# Patient Record
Sex: Female | Born: 1985
Health system: Southern US, Community
[De-identification: ages and names within clinical notes are randomized; demographics above are authoritative.]

## PROBLEM LIST (undated history)

## (undated) DIAGNOSIS — J45909 Unspecified asthma, uncomplicated: Secondary | ICD-10-CM

## (undated) DIAGNOSIS — T7840XA Allergy, unspecified, initial encounter: Secondary | ICD-10-CM

## (undated) HISTORY — DX: Allergy, unspecified, initial encounter: T78.40XA

## (undated) HISTORY — PX: HIP SURGERY: SHX245

---
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
## 2005-11-12 ENCOUNTER — Ambulatory Visit (HOSPITAL_COMMUNITY): Admission: RE | Admit: 2005-11-12 | Discharge: 2005-11-12 | Payer: Self-pay | Admitting: Family Medicine

## 2005-11-12 ENCOUNTER — Emergency Department (HOSPITAL_COMMUNITY): Admission: EM | Admit: 2005-11-12 | Discharge: 2005-11-12 | Payer: Self-pay | Admitting: Family Medicine

## 2006-07-14 ENCOUNTER — Emergency Department (HOSPITAL_COMMUNITY): Admission: EM | Admit: 2006-07-14 | Discharge: 2006-07-14 | Payer: Self-pay | Admitting: Family Medicine

## 2007-04-11 ENCOUNTER — Emergency Department (HOSPITAL_COMMUNITY): Admission: EM | Admit: 2007-04-11 | Discharge: 2007-04-11 | Payer: Self-pay | Admitting: Emergency Medicine

## 2007-05-27 ENCOUNTER — Emergency Department (HOSPITAL_COMMUNITY): Admission: EM | Admit: 2007-05-27 | Discharge: 2007-05-28 | Payer: Self-pay | Admitting: Emergency Medicine

## 2007-10-16 ENCOUNTER — Emergency Department (HOSPITAL_COMMUNITY): Admission: EM | Admit: 2007-10-16 | Discharge: 2007-10-16 | Payer: Self-pay | Admitting: Emergency Medicine

## 2007-12-28 ENCOUNTER — Emergency Department (HOSPITAL_COMMUNITY): Admission: EM | Admit: 2007-12-28 | Discharge: 2007-12-28 | Payer: Self-pay | Admitting: Emergency Medicine

## 2008-01-22 ENCOUNTER — Emergency Department (HOSPITAL_COMMUNITY): Admission: EM | Admit: 2008-01-22 | Discharge: 2008-01-22 | Payer: Self-pay | Admitting: Emergency Medicine

## 2009-06-24 ENCOUNTER — Emergency Department (HOSPITAL_COMMUNITY): Admission: EM | Admit: 2009-06-24 | Discharge: 2009-06-24 | Payer: Self-pay | Admitting: Emergency Medicine

## 2009-10-25 ENCOUNTER — Emergency Department (HOSPITAL_COMMUNITY): Admission: EM | Admit: 2009-10-25 | Discharge: 2009-10-25 | Payer: Self-pay | Admitting: Family Medicine

## 2010-12-11 LAB — RAPID STREP SCREEN (MED CTR MEBANE ONLY): Streptococcus, Group A Screen (Direct): NEGATIVE

## 2011-05-29 LAB — URINE CULTURE: Colony Count: 70000

## 2011-05-29 LAB — URINALYSIS, ROUTINE W REFLEX MICROSCOPIC
Glucose, UA: NEGATIVE
Ketones, ur: 15 — AB
Protein, ur: NEGATIVE
Urobilinogen, UA: 1

## 2011-05-29 LAB — URINE MICROSCOPIC-ADD ON

## 2011-06-22 LAB — COMPREHENSIVE METABOLIC PANEL
Albumin: 4.1
Alkaline Phosphatase: 58
BUN: 7
CO2: 26
Chloride: 108
Creatinine, Ser: 0.81
GFR calc non Af Amer: >60
Glucose, Bld: 90
Potassium: 4.2
Total Bilirubin: 1

## 2011-06-22 LAB — URINALYSIS, ROUTINE W REFLEX MICROSCOPIC
Glucose, UA: NEGATIVE
Ketones, ur: 15 — AB
Protein, ur: NEGATIVE
Urobilinogen, UA: 1

## 2011-06-22 LAB — CBC
HCT: 38
MCV: 91.8
Platelets: 200
RDW: 12.7
WBC: 3.2 — ABNORMAL LOW

## 2011-06-22 LAB — I-STAT 8, (EC8 V) (CONVERTED LAB)
Acid-base deficit: 1
Bicarbonate: 23.3
Glucose, Bld: 91
TCO2: 24
pCO2, Ven: 37.7 — ABNORMAL LOW
pH, Ven: 7.398 — ABNORMAL HIGH

## 2011-06-22 LAB — DIFFERENTIAL
Basophils Absolute: 0
Eosinophils Absolute: 0.1
Eosinophils Relative: 3
Neutrophils Relative %: 43

## 2011-06-22 LAB — POCT I-STAT CREATININE
Creatinine, Ser: 0.9
Operator id: 285841

## 2011-06-22 LAB — POCT PREGNANCY, URINE: Operator id: 285841

## 2011-06-22 LAB — URINE MICROSCOPIC-ADD ON

## 2011-06-22 LAB — LIPASE, BLOOD: Lipase: 14

## 2011-08-26 ENCOUNTER — Other Ambulatory Visit: Payer: Self-pay

## 2011-08-26 ENCOUNTER — Encounter: Payer: Self-pay | Admitting: Emergency Medicine

## 2011-08-26 ENCOUNTER — Emergency Department (HOSPITAL_COMMUNITY)
Admission: EM | Admit: 2011-08-26 | Discharge: 2011-08-27 | Disposition: A | Discharge status: Home / Self Care | Payer: Self-pay | Attending: Emergency Medicine | Admitting: Emergency Medicine

## 2011-08-26 ENCOUNTER — Emergency Department (HOSPITAL_COMMUNITY): Payer: Self-pay

## 2011-08-26 DIAGNOSIS — R079 Chest pain, unspecified: Secondary | ICD-10-CM | POA: Insufficient documentation

## 2011-08-26 DIAGNOSIS — R059 Cough, unspecified: Secondary | ICD-10-CM | POA: Insufficient documentation

## 2011-08-26 DIAGNOSIS — R05 Cough: Secondary | ICD-10-CM | POA: Insufficient documentation

## 2011-08-26 DIAGNOSIS — R509 Fever, unspecified: Secondary | ICD-10-CM | POA: Insufficient documentation

## 2011-08-26 DIAGNOSIS — J111 Influenza due to unidentified influenza virus with other respiratory manifestations: Secondary | ICD-10-CM | POA: Insufficient documentation

## 2011-08-26 DIAGNOSIS — R0601 Orthopnea: Secondary | ICD-10-CM | POA: Insufficient documentation

## 2011-08-26 DIAGNOSIS — IMO0001 Reserved for inherently not codable concepts without codable children: Secondary | ICD-10-CM | POA: Insufficient documentation

## 2011-08-26 DIAGNOSIS — J45901 Unspecified asthma with (acute) exacerbation: Secondary | ICD-10-CM | POA: Insufficient documentation

## 2011-08-26 NOTE — ED Notes (Signed)
Pt sts that today she started to have central CP described as a burning and sharp pain. Pain increases with movement and palpation.

## 2011-08-27 ENCOUNTER — Emergency Department (HOSPITAL_COMMUNITY): Payer: Self-pay

## 2011-08-27 LAB — POCT I-STAT, CHEM 8
Chloride: 106 mEq/L (ref 96–112)
HCT: 40 % (ref 36.0–46.0)
Potassium: 3.5 mEq/L (ref 3.5–5.1)

## 2011-08-27 LAB — CBC
HCT: 36.4 % (ref 36.0–46.0)
Platelets: 155 10*3/uL (ref 150–400)
RBC: 4.07 MIL/uL (ref 3.87–5.11)
RDW: 12.4 % (ref 11.5–15.5)
WBC: 5.5 10*3/uL (ref 4.0–10.5)

## 2011-08-27 LAB — POCT I-STAT TROPONIN I: Troponin i, poc: 0 ng/mL (ref 0.00–0.08)

## 2011-08-27 LAB — POCT PREGNANCY, URINE: Preg Test, Ur: NEGATIVE

## 2011-08-27 MED ORDER — OSELTAMIVIR PHOSPHATE 75 MG PO CAPS: 75.0000 mg | ORAL_CAPSULE | Freq: Two times a day (BID) | ORAL | Status: AC

## 2011-08-27 MED ORDER — ALBUTEROL SULFATE (5 MG/ML) 0.5% IN NEBU
2.5000 mg | INHALATION_SOLUTION | Freq: Once | RESPIRATORY_TRACT | Status: AC
Start: 2011-08-27 — End: 2011-08-27
  Administered 2011-08-27: 2.5 mg via RESPIRATORY_TRACT
  Filled 2011-08-27: qty 0.5

## 2011-08-27 MED ORDER — IBUPROFEN 800 MG PO TABS
800.0000 mg | ORAL_TABLET | Freq: Once | ORAL | Status: DC
  Filled 2011-08-27: qty 1

## 2011-08-27 MED ORDER — ALBUTEROL SULFATE HFA 108 (90 BASE) MCG/ACT IN AERS: 1.0000 | INHALATION_SPRAY | Freq: Four times a day (QID) | RESPIRATORY_TRACT | Status: DC | PRN

## 2011-08-27 MED ORDER — IPRATROPIUM BROMIDE 0.02 % IN SOLN
0.5000 mg | Freq: Once | RESPIRATORY_TRACT | Status: AC
  Administered 2011-08-27: 0.5 mg via RESPIRATORY_TRACT
  Filled 2011-08-27: qty 2.5

## 2011-08-27 MED ORDER — TRAMADOL HCL 50 MG PO TABS
50.0000 mg | ORAL_TABLET | Freq: Once | ORAL | Status: AC
  Administered 2011-08-27: 50 mg via ORAL
  Filled 2011-08-27: qty 1

## 2011-08-27 NOTE — ED Notes (Signed)
Pt complaining that she has been having mid-sternal chest pain since yesterday. She has been coughing and complaining of SOB. Currently, she is not SOB. No CP at this time. But according, the patient she had a near syncopal episode while walking to the bed. She is not dizzy at this moment. Lung sounds expiratory wheezing. Will continue to monitor.

## 2011-08-27 NOTE — ED Provider Notes (Signed)
History     CSN: [id number] Arrival date & time: 08/26/2011  8:57 PM   First MD Initiated Contact with Patient 08/27/11 0024      Chief Complaint  Patient presents with  . Chest Pain    (Consider location/radiation/quality/duration/timing/severity/associated sxs/prior treatment) Patient is a 25 y.o. female presenting with chest pain and wheezing. The history is provided by the patient. No language interpreter was used.  Chest Pain The chest pain began yesterday. Duration of episode(s) is 12 hours. Chest pain occurs constantly. The chest pain is unchanged. Associated with: myalgias wheezing and fever. At its most intense, the pain is at 9/10. The pain is currently at 9/10. The quality of the pain is described as sharp. The pain does not radiate. Exacerbated by: nothing. Primary symptoms include a fever, cough and wheezing. Pertinent negatives for primary symptoms include no fatigue, no syncope, no shortness of breath, no palpitations, no abdominal pain, no nausea, no vomiting, no dizziness and no altered mental status.  The patient's medical history is significant for asthma.  Associated symptoms include orthopnea.  Pertinent negatives for associated symptoms include no claudication, no diaphoresis and no lower extremity edema. She tried nothing for the symptoms. Risk factors include no known risk factors.  Pertinent negatives for past medical history include no aneurysm and no Marfan's syndrome.  Pertinent negatives for family medical history include: family history of aortic dissection.  Procedure history is negative for cardiac catheterization, echocardiogram, persantine thallium, stress echo, stress thallium and exercise treadmill test.    Wheezing  The current episode started yesterday. The onset was gradual. The problem occurs continuously. The problem has been unchanged. The problem is moderate. The symptoms are relieved by nothing. The symptoms are aggravated by nothing. Associated  symptoms include chest pain, orthopnea, a fever, cough and wheezing. Pertinent negatives include no sore throat, no stridor and no shortness of breath. There was no intake of a foreign body. She was not exposed to toxic fumes. She has not inhaled smoke recently. She has had no prior hospitalizations. She has had no prior ICU admissions. She has had no prior intubations. Her past medical history is significant for asthma. She has been behaving normally. Urine output has been normal. The last void occurred less than 6 hours ago. There were sick contacts at home and at school. She has received no recent medical care.  Roommate has influenza and she has myalgias, wheezing fevers, chills.  PERC negative, no ocp no long car trips or plane trips no swelling of the lower extremities  History reviewed. No pertinent past medical history.  History reviewed. No pertinent past surgical history.  History reviewed. No pertinent family history.  History  Substance Use Topics  . Smoking status: Never Smoker   . Smokeless tobacco: Not on file  . Alcohol Use: Yes    OB History    Grav Para Term Preterm Abortions TAB SAB Ect Mult Living                  Review of Systems  Constitutional: Positive for fever. Negative for diaphoresis and fatigue.  HENT: Negative for sore throat.   Eyes: Negative for discharge.  Respiratory: Positive for cough and wheezing. Negative for shortness of breath and stridor.   Cardiovascular: Positive for chest pain and orthopnea. Negative for palpitations, claudication and syncope.  Gastrointestinal: Negative for nausea, vomiting and abdominal pain.  Genitourinary: Negative for difficulty urinating.  Neurological: Negative for dizziness.  Psychiatric/Behavioral: Negative for altered mental status.  Allergies  Review of patient's allergies indicates no known allergies.  Home Medications   Current Outpatient Rx  Name Route Sig Dispense Refill  . VITAMIN C PO Oral Take 1  tablet by mouth daily.      . DAYQUIL PO Oral Take 1 tablet by mouth 2 (two) times daily as needed. For cold symptoms.     Marland Kitchen VITAMIN A PO Oral Take 1 tablet by mouth daily.        BP 121/58  Pulse 94  Temp(Src) 100.6 F (38.1 C) (Oral)  Resp 16  SpO2 97%  LMP 07/29/2011  Physical Exam  Constitutional: She is oriented to person, place, and time. She appears well-developed and well-nourished.  HENT:  Head: Normocephalic and atraumatic.  Mouth/Throat: Oropharynx is clear and moist. No oropharyngeal exudate.  Eyes: EOM are normal. Pupils are equal, round, and reactive to light.  Neck: Normal range of motion. Neck supple. No tracheal deviation present.  Cardiovascular: Normal rate and regular rhythm.   Pulmonary/Chest: Effort normal. No stridor. She has wheezes.  Abdominal: Soft. Bowel sounds are normal. There is no tenderness. There is no rebound and no guarding.  Musculoskeletal: Normal range of motion.  Lymphadenopathy:    She has cervical adenopathy.  Neurological: She is alert and oriented to person, place, and time.  Skin: Skin is warm and dry.  Psychiatric: Thought content normal.    ED Course  Procedures (including critical care time)   Labs Reviewed  CBC  POCT I-STAT, CHEM 8  POCT I-STAT TROPONIN I  POCT PREGNANCY, URINE  I-STAT, CHEM 8  I-STAT TROPONIN I  POCT PREGNANCY, URINE   Dg Chest 2 View  08/26/2011  *RADIOLOGY REPORT*  Clinical Data: Chest pain and short of breath  CHEST - 2 VIEW  Comparison:  None.  Findings:  The heart size and mediastinal contours are within normal limits.  Both lungs are clear.  The visualized skeletal structures are unremarkable.  IMPRESSION: No active cardiopulmonary disease.  Original Report Authenticated By: Camelia Phenes, M.D.     No diagnosis found.    MDM   Date: 08/27/2011  Rate:95  Rhythm: sinus arrhythmia  QRS Axis: sinus with sinus arrhythmia  Intervals: normal  ST/T Wave abnormalities: normal  Conduction  Disutrbances:none  Narrative Interpretation:   Old EKG Reviewed: none available   PERC negative, highly doubt PE  Symptoms consistent with asthma and influenza.  Will prescribe tamiflu and have patient follow up with PMD for recheck.  Return for worsening chest pain shortness of breath or any concerns.  Patient and parents verbalize understanding and agree to follow up    Aziah Brostrom K Rasa Degrazia-Rasch, MD 08/27/11 [phone]

## 2013-04-27 ENCOUNTER — Encounter (HOSPITAL_COMMUNITY): Payer: Self-pay | Admitting: Emergency Medicine

## 2013-04-27 ENCOUNTER — Emergency Department (HOSPITAL_COMMUNITY)
Admission: EM | Admit: 2013-04-27 | Discharge: 2013-04-28 | Disposition: A | Discharge status: Home / Self Care | Payer: Self-pay | Attending: Emergency Medicine | Admitting: Emergency Medicine

## 2013-04-27 DIAGNOSIS — R05 Cough: Secondary | ICD-10-CM | POA: Insufficient documentation

## 2013-04-27 DIAGNOSIS — J45909 Unspecified asthma, uncomplicated: Secondary | ICD-10-CM | POA: Insufficient documentation

## 2013-04-27 DIAGNOSIS — R599 Enlarged lymph nodes, unspecified: Secondary | ICD-10-CM | POA: Insufficient documentation

## 2013-04-27 DIAGNOSIS — R059 Cough, unspecified: Secondary | ICD-10-CM | POA: Insufficient documentation

## 2013-04-27 DIAGNOSIS — H53149 Visual discomfort, unspecified: Secondary | ICD-10-CM | POA: Insufficient documentation

## 2013-04-27 DIAGNOSIS — Z3202 Encounter for pregnancy test, result negative: Secondary | ICD-10-CM | POA: Insufficient documentation

## 2013-04-27 DIAGNOSIS — R5381 Other malaise: Secondary | ICD-10-CM | POA: Insufficient documentation

## 2013-04-27 DIAGNOSIS — J029 Acute pharyngitis, unspecified: Secondary | ICD-10-CM | POA: Insufficient documentation

## 2013-04-27 DIAGNOSIS — R110 Nausea: Secondary | ICD-10-CM | POA: Insufficient documentation

## 2013-04-27 DIAGNOSIS — H93239 Hyperacusis, unspecified ear: Secondary | ICD-10-CM | POA: Insufficient documentation

## 2013-04-27 DIAGNOSIS — B2790 Infectious mononucleosis, unspecified without complication: Secondary | ICD-10-CM | POA: Insufficient documentation

## 2013-04-27 DIAGNOSIS — R51 Headache: Secondary | ICD-10-CM | POA: Insufficient documentation

## 2013-04-27 HISTORY — DX: Unspecified asthma, uncomplicated: J45.909

## 2013-04-27 NOTE — ED Notes (Signed)
PT. REPORTS NASAL CONGESTION " PRESSURE " / RUNNY NOSE / LIGHTHEADED ONSET YESTERDAY.

## 2013-04-28 LAB — POCT I-STAT, CHEM 8
BUN: 14 mg/dL (ref 6–23)
Chloride: 111 mEq/L (ref 96–112)
Creatinine, Ser: 0.8 mg/dL (ref 0.50–1.10)
Glucose, Bld: 97 mg/dL (ref 70–99)
Potassium: 3.6 mEq/L (ref 3.5–5.1)

## 2013-04-28 LAB — URINALYSIS, ROUTINE W REFLEX MICROSCOPIC
Glucose, UA: NEGATIVE mg/dL
Ketones, ur: NEGATIVE mg/dL
Leukocytes, UA: NEGATIVE
Protein, ur: NEGATIVE mg/dL

## 2013-04-28 MED ORDER — PROMETHAZINE HCL 25 MG PO TABS: 25.0000 mg | ORAL_TABLET | Freq: Four times a day (QID) | ORAL | Status: DC | PRN

## 2013-04-28 MED ORDER — IBUPROFEN 800 MG PO TABS: 800.0000 mg | ORAL_TABLET | Freq: Three times a day (TID) | ORAL | Status: DC

## 2013-04-28 MED ORDER — METOCLOPRAMIDE HCL 5 MG/ML IJ SOLN
10.0000 mg | Freq: Once | INTRAMUSCULAR | Status: AC
  Administered 2013-04-28: 10 mg via INTRAMUSCULAR
  Filled 2013-04-28: qty 2

## 2013-04-28 MED ORDER — PSEUDOEPHEDRINE HCL 30 MG PO TABS
30.0000 mg | ORAL_TABLET | Freq: Once | ORAL | Status: AC
  Administered 2013-04-28: 30 mg via ORAL
  Filled 2013-04-28: qty 1

## 2013-04-28 MED ORDER — ONDANSETRON 4 MG PO TBDP
8.0000 mg | ORAL_TABLET | Freq: Once | ORAL | Status: AC
Start: 2013-04-28 — End: 2013-04-28
  Administered 2013-04-28: 8 mg via ORAL
  Filled 2013-04-28: qty 2

## 2013-04-28 NOTE — ED Provider Notes (Signed)
CSN: [id number]     Arrival date & time 04/27/13  2341 History     None    Chief Complaint  Patient presents with  . Nasal Congestion   (Consider location/radiation/quality/duration/timing/severity/associated sxs/prior Treatment) HPI History provided by pt.   27yo F presents w/ severe, non-traumatic, frontal head pressure for the last 2 days.  Pain worsened last night.  Was associated w/ blurred vision, but that has resolved.  Has also had nasal congestion, intermittent sore throat and cough, photo/phonophobia, nausea, generalized weakness and fatigue.  LMP 4 days ago and bleed heavily (10 tampons and 5 pads in <24hrs).  Denies urinary sx.  Took an OTC decongestant w/out relief.  H/o sinusitis and this pain feels similar but more severe.  Has never had a migraine. Past Medical History  Diagnosis Date  . Asthma    Past Surgical History  Procedure Laterality Date  . Hip surgery     No family history on file. History  Substance Use Topics  . Smoking status: Never Smoker   . Smokeless tobacco: Not on file  . Alcohol Use: Yes   OB History   Grav Para Term Preterm Abortions TAB SAB Ect Mult Living                 Review of Systems  All other systems reviewed and are negative.    Allergies  Claritin and Flonase  Home Medications   Current Outpatient Rx  Name  Route  Sig  Dispense  Refill  . DiphenhydrAMINE HCl (MULTI-SYMPTOM ALLERGY PO)   Oral   Take 1 tablet by mouth every 6 (six) hours as needed (sinus congestion).          BP 139/68  Pulse 63  Temp(Src) 98.7 F (37.1 C) (Oral)  Resp 14  SpO2 100%  LMP 04/20/2013 Physical Exam  Nursing note and vitals reviewed. Constitutional: She is oriented to person, place, and time. She appears well-developed and well-nourished. No distress.  HENT:  Head: Normocephalic and atraumatic.  Pt points to pain at bridge of her nose.  Sinuses non-tender; palpation relieves some of the pressure.  Soft palate and posterior pharynx  erythematous.  No edema or exudate of tonsils.    Eyes:  Normal appearance  Neck: Normal range of motion.  No meningismus  Cardiovascular: Normal rate, regular rhythm and intact distal pulses.   Pulmonary/Chest: Effort normal and breath sounds normal.  Abdominal: Soft. Bowel sounds are normal. She exhibits no distension. There is no tenderness.  Musculoskeletal: Normal range of motion.  Lymphadenopathy:    She has cervical adenopathy.  Neurological: She is alert and oriented to person, place, and time. No sensory deficit. Coordination normal.  CN 3-12 intact.  No nystagmus. 5/5 and equal upper and lower extremity strength.  No past pointing.     Skin: Skin is warm and dry. No rash noted.  Psychiatric: She has a normal mood and affect. Her behavior is normal.    ED Course   Procedures (including critical care time)  Labs Reviewed  MONONUCLEOSIS SCREEN - Abnormal; Notable for the following:    Mono Screen POSITIVE (*)    All other components within normal limits  URINALYSIS, ROUTINE W REFLEX MICROSCOPIC  POCT PREGNANCY, URINE  POCT I-STAT, CHEM 8   No results found. 1. Mononucleosis     MDM  27yo healthy F presents w/ frontal headache, nausea, fatigue and generalized weakness.  Headache pain feels similar to past sinus infections but more severe. On exam,  afebrile, non-toxic appearing, no focal neuro deficits or meningeal signs, no sinus ttp, throat erythematous, anterior cervical adenopathy, abd benign, no rash.  Chem 8, monospot, U/A and urine preg pending.  Pt to receive IM reglan for headache pain and nausea, as well as sudafed. 1:45 AM   Headache and nausea improved.  Labs sig for positive monospot.  Results discussed w/ pt and her mother.  She is not involved in any contact sports.  Recommended rest, fluids, NSAIDs.  Prescribed phenergan for nausea and to be taken with benadryl for headache relief.  She understands that this is a contagious virus.  Return precautions  discussed.   Arie Sabina Azzure Garabedian, PA-C 04/28/13 0500

## 2013-04-28 NOTE — ED Notes (Signed)
Patient with sinus pressure, nausea and dizziness.  Patient states that it started yesterday, increasing today.

## 2013-04-28 NOTE — ED Notes (Signed)
Pt comfortable with d/c and f/u instructions. Prescriptions x2. 

## 2013-04-29 NOTE — ED Provider Notes (Signed)
Medical screening examination/treatment/procedure(s) were performed by non-physician practitioner and as supervising physician I was immediately available for consultation/collaboration.   Jakaiden Fill Y. Breslyn Abdo, MD 04/29/13 0140 

## 2013-05-08 ENCOUNTER — Encounter (HOSPITAL_COMMUNITY): Payer: Self-pay

## 2013-05-08 ENCOUNTER — Emergency Department (INDEPENDENT_AMBULATORY_CARE_PROVIDER_SITE_OTHER)
Admission: EM | Admit: 2013-05-08 | Discharge: 2013-05-08 | Disposition: A | Discharge status: Home / Self Care | Payer: Self-pay | Source: Home / Self Care | Attending: Emergency Medicine | Admitting: Emergency Medicine

## 2013-05-08 DIAGNOSIS — R51 Headache: Secondary | ICD-10-CM

## 2013-05-08 DIAGNOSIS — B2790 Infectious mononucleosis, unspecified without complication: Secondary | ICD-10-CM

## 2013-05-08 MED ORDER — IBUPROFEN 600 MG PO TABS: 600.0000 mg | ORAL_TABLET | Freq: Three times a day (TID) | ORAL | Status: DC

## 2013-05-08 NOTE — ED Provider Notes (Signed)
CSN: [id number]     Arrival date & time 05/08/13  1341 History   First MD Initiated Contact with Patient 05/08/13 1453     Chief Complaint  Patient presents with  . Headache   (Consider location/radiation/quality/duration/timing/severity/associated sxs/prior Treatment) HPI Comments: Pt wants to return to work after dx of mono 1.5 weeks ago.  Here because needs work note.  Feels much better, doesn't feel fatigued.  Also reports still has mild headache, has been taking ibuprofen 800mg  once daily to completely manage headache, wants refill of med.    The history is provided by the patient.    Past Medical History  Diagnosis Date  . Asthma    Past Surgical History  Procedure Laterality Date  . Hip surgery     History reviewed. No pertinent family history. History  Substance Use Topics  . Smoking status: Never Smoker   . Smokeless tobacco: Not on file  . Alcohol Use: Yes   OB History   Grav Para Term Preterm Abortions TAB SAB Ect Mult Living                 Review of Systems  HENT: Negative for sore throat.   Respiratory: Negative for cough.   Neurological: Positive for headaches.    Allergies  Claritin and Flonase  Home Medications   Current Outpatient Rx  Name  Route  Sig  Dispense  Refill  . DiphenhydrAMINE HCl (MULTI-SYMPTOM ALLERGY PO)   Oral   Take 1 tablet by mouth every 6 (six) hours as needed (sinus congestion).         Marland Kitchen ibuprofen (ADVIL,MOTRIN) 600 MG tablet   Oral   Take 1 tablet (600 mg total) by mouth 3 (three) times daily.   21 tablet   0   . ibuprofen (ADVIL,MOTRIN) 800 MG tablet   Oral   Take 1 tablet (800 mg total) by mouth 3 (three) times daily.   12 tablet   0   . promethazine (PHENERGAN) 25 MG tablet   Oral   Take 1 tablet (25 mg total) by mouth every 6 (six) hours as needed for nausea.   20 tablet   0    BP 161/118  Pulse 64  Temp(Src) 98.6 F (37 C) (Oral)  Resp 18  SpO2 100%  LMP 04/20/2013 Physical Exam  Constitutional:  She appears well-developed and well-nourished. She appears distressed.  Cardiovascular: Normal rate and regular rhythm.   Pulmonary/Chest: Effort normal and breath sounds normal.    ED Course  Procedures (including critical care time) Labs Review Labs Reviewed - No data to display Imaging Review No results found.  bp recheck was 121/68 using correct size of cuff MDM   1. Mononucleosis   2. Headache    Given note to return to work. Discussed with pt she is likely to be contagious for many more weeks to months, to avoid saliva exchange (kissing, sharing drinks) for 6 weeks, don't cough on anyone, careful handwashing. Reinforced if pt is fatigued that she needs to rest. Advised pt to cut down on ibuprofen by tapering, rx ibuprofen 600mg  TID prn pain #21 to prevent rebound headaches.     Cathlyn Parsons, NP 05/08/13 1512

## 2013-05-08 NOTE — ED Provider Notes (Signed)
Medical screening examination/treatment/procedure(s) were performed by non-physician practitioner and as supervising physician I was immediately available for consultation/collaboration.  Leslee Home, M.D.  Reuben Likes, MD 05/08/13 [phone]

## 2013-05-08 NOTE — ED Notes (Addendum)
Seen 3 weeks ago w d/c diagnosis of mono. 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

## 2013-08-21 ENCOUNTER — Encounter (HOSPITAL_COMMUNITY): Payer: Self-pay | Admitting: Emergency Medicine

## 2013-08-21 ENCOUNTER — Emergency Department (HOSPITAL_COMMUNITY)
Admission: EM | Admit: 2013-08-21 | Discharge: 2013-08-21 | Disposition: A | Discharge status: Home / Self Care | Payer: Self-pay | Attending: Emergency Medicine | Admitting: Emergency Medicine

## 2013-08-21 DIAGNOSIS — M545 Low back pain, unspecified: Secondary | ICD-10-CM | POA: Insufficient documentation

## 2013-08-21 DIAGNOSIS — Z79899 Other long term (current) drug therapy: Secondary | ICD-10-CM | POA: Insufficient documentation

## 2013-08-21 DIAGNOSIS — J45909 Unspecified asthma, uncomplicated: Secondary | ICD-10-CM | POA: Insufficient documentation

## 2013-08-21 DIAGNOSIS — Z888 Allergy status to other drugs, medicaments and biological substances status: Secondary | ICD-10-CM | POA: Insufficient documentation

## 2013-08-21 MED ORDER — HYDROCODONE-ACETAMINOPHEN 5-325 MG PO TABS: 1.0000 | ORAL_TABLET | ORAL | Status: DC | PRN

## 2013-08-21 MED ORDER — HYDROCODONE-ACETAMINOPHEN 5-325 MG PO TABS
1.0000 | ORAL_TABLET | Freq: Once | ORAL | Status: AC
  Administered 2013-08-21: 1 via ORAL
  Filled 2013-08-21: qty 1

## 2013-08-21 MED ORDER — CYCLOBENZAPRINE HCL 10 MG PO TABS: 10.0000 mg | ORAL_TABLET | Freq: Every day | ORAL | Status: DC

## 2013-08-21 MED ORDER — DEXAMETHASONE SODIUM PHOSPHATE 10 MG/ML IJ SOLN
10.0000 mg | Freq: Once | INTRAMUSCULAR | Status: AC
  Administered 2013-08-21: 10 mg via INTRAMUSCULAR
  Filled 2013-08-21: qty 1

## 2013-08-21 NOTE — ED Notes (Signed)
Pt reports L hip surgery as a child with chronic mild lower back pain since. This week back pain has increased in severity. Thinks she overdid it working long hours. Tried otc pain pills and topical creams with no relief of pain. No bowel/bladder changes

## 2013-08-21 NOTE — ED Provider Notes (Signed)
CSN: [id number]     Arrival date & time 08/21/13  1537 History  This chart was scribed for non-physician practitioner Mellody Drown, PA-C working with Nelia Shi, MD by Valera Castle, ED scribe. This patient was seen in room TR10C/TR10C and the patient's care was started at 4:43 PM.   Chief Complaint  Patient presents with  . Back Pain    The history is provided by the patient. No language interpreter was used.   HPI Comments: Brianna Garrett is a 27 y.o. female who presents to the Emergency Department complaining of sudden, burning, throbbing, constant, lower back pain, onset 1 week ago, with a pain severity of 7/10. She states the pain is exacerbated by reaching up with her arms, standing up, bending over, and any other kind of movement. She denies any recent back injury or fall. She reports h/o left hip surgery when she was a child, and was told she was going to have hip problems in the future. She reports taking Aleve, Ibuprofen for her pain, and applying [facility], all without any relief. 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 She denies fever, constipation, abdominal pain, bladder and bowel incontinence, numbness, tingling, and any other associated symptoms. She denies any allergies to medication. She denies h/o DM, but states that DM and heart disease run in the family.   PCP - No PCP Per Patient  Past Medical History  Diagnosis Date  . Asthma    Past Surgical History  Procedure Laterality Date  . Hip surgery     History reviewed. No pertinent family history. History  Substance Use Topics  . Smoking status: Never Smoker   . Smokeless tobacco: Not on file  . Alcohol Use: Yes   OB History   Grav Para Term Preterm Abortions TAB SAB Ect Mult Living                 Review of Systems  Constitutional: Negative for fever.  Gastrointestinal: Negative for abdominal pain and constipation.       Negative for bowel incontinence.  Genitourinary:   Negative for bladder incontinence.  Musculoskeletal: Positive for back pain (lower).  Neurological: Negative for numbness (and tingling).  All other systems reviewed and are negative.    Allergies  Claritin and Flonase  Home Medications   Current Outpatient Rx  Name  Route  Sig  Dispense  Refill  . DiphenhydrAMINE HCl (MULTI-SYMPTOM ALLERGY PO)   Oral   Take 1 tablet by mouth every 6 (six) hours as needed (sinus congestion).         Marland Kitchen ibuprofen (ADVIL,MOTRIN) 600 MG tablet   Oral   Take 1 tablet (600 mg total) by mouth 3 (three) times daily.   21 tablet   0   . ibuprofen (ADVIL,MOTRIN) 800 MG tablet   Oral   Take 1 tablet (800 mg total) by mouth 3 (three) times daily.   12 tablet   0   . promethazine (PHENERGAN) 25 MG tablet   Oral   Take 1 tablet (25 mg total) by mouth every 6 (six) hours as needed for nausea.   20 tablet   0    BP 125/92  Pulse 73  Temp(Src) 97.8 F (36.6 C) (Oral)  Resp 16  Ht 5\' 10"  (1.778 m)  Wt 190 lb (86.183 kg)  BMI 27.26 kg/m2  SpO2 98%  Physical Exam  Nursing note and vitals reviewed. Constitutional: She is oriented to person, place,  and time. She appears well-developed and well-nourished. No distress.  HENT:  Head: Normocephalic and atraumatic.  Eyes: EOM are normal.  Neck: Neck supple.  Cardiovascular: Normal rate.   Pulmonary/Chest: Effort normal. No respiratory distress.  Musculoskeletal: Normal range of motion. She exhibits no edema.       Cervical back: Normal.       Thoracic back: Normal.       Lumbar back: She exhibits tenderness. She exhibits no swelling, no deformity and no spasm.  Discomfort recreated with palpation of Left SI joint. No midline C-spine, T-spine, or L-spine tenderness with no step-offs, crepitus, or deformities noted.   Neurological: She is alert and oriented to person, place, and time. No sensory deficit.  Skin: Skin is warm and dry.  Psychiatric: She has a normal mood and affect. Her behavior is  normal.    ED Course  Procedures (including critical care time)  DIAGNOSTIC STUDIES: Oxygen Saturation is 98% on room air, normal by my interpretation.    COORDINATION OF CARE: 4:49 PM-Discussed treatment plan which includes clinical doubt of fx with pt at bedside and pt agreed to plan. Will give pt Decadron and Norco/Vicodin. Will give pt resources for f/u with back specialist.   Labs Review Labs Reviewed - No data to display Imaging Review No results found.  EKG Interpretation   None      No orders of the defined types were placed in this encounter.    MDM   1. Low back pain    Pt with a history of lower back pain presents with an increase in pain.  No red flags on exam or given in history.  Likely lumbosacral pain. Discussed treatment plan with the patient. Return precautions given. Reports understanding and no other concerns at this time.  Patient is stable for discharge at this time.   Meds given in ED:  Medications  dexamethasone (DECADRON) injection 10 mg (10 mg Intramuscular Given 08/21/13 1709)  HYDROcodone-acetaminophen (NORCO/VICODIN) 5-325 MG per tablet 1 tablet (1 tablet Oral Given 08/21/13 1709)    Discharge Medication List as of 08/21/2013  5:12 PM    START taking these medications   Details  cyclobenzaprine (FLEXERIL) 10 MG tablet Take 1 tablet (10 mg total) by mouth at bedtime., Starting 08/21/2013, Until Discontinued, Print    HYDROcodone-acetaminophen (NORCO/VICODIN) 5-325 MG per tablet Take 1 tablet by mouth every 4 (four) hours as needed., Starting 08/21/2013, Until Discontinued, Print        I personally performed the services described in this documentation, which was scribed in my presence. The recorded information has been reviewed and is accurate.    Clabe Seal, PA-C 08/24/13 2102

## 2013-09-07 NOTE — ED Provider Notes (Signed)
Medical screening examination/treatment/procedure(s) were performed by non-physician practitioner and as supervising physician I was immediately available for consultation/collaboration.   Nelia Shiobert L Amaurie Schreckengost, MD 09/07/13 [phone]

## 2013-10-14 ENCOUNTER — Encounter (HOSPITAL_COMMUNITY): Payer: Self-pay | Admitting: Emergency Medicine

## 2013-10-14 ENCOUNTER — Emergency Department (HOSPITAL_COMMUNITY)
Admission: EM | Admit: 2013-10-14 | Discharge: 2013-10-14 | Disposition: A | Discharge status: Home / Self Care | Payer: Self-pay | Source: Home / Self Care

## 2013-10-14 DIAGNOSIS — R0982 Postnasal drip: Secondary | ICD-10-CM

## 2013-10-14 DIAGNOSIS — J069 Acute upper respiratory infection, unspecified: Secondary | ICD-10-CM

## 2013-10-14 DIAGNOSIS — R21 Rash and other nonspecific skin eruption: Secondary | ICD-10-CM

## 2013-10-14 MED ORDER — CLOTRIMAZOLE-BETAMETHASONE 1-0.05 % EX CREA: TOPICAL_CREAM | CUTANEOUS | Status: DC

## 2013-10-14 NOTE — ED Notes (Signed)
Multiple complaints.  Patient has head cold and chest congestion, chest pain-dull, worse with cough, deep breathing, etc.  Patient also concerned for rash on thigh.  Patient concerned for diabetes, feels she has poor circulation

## 2013-10-14 NOTE — ED Provider Notes (Signed)
Medical screening examination/treatment/procedure(s) were performed by a resident physician or non-physician practitioner and as the supervising physician I was immediately available for consultation/collaboration.  Tima Curet, MD    Ambert Virrueta S Legna Mausolf, MD 10/14/13 2030 

## 2013-10-14 NOTE — Discharge Instructions (Signed)
Upper Respiratory Infection, Adult °An upper respiratory infection (URI) is also sometimes known as the common cold. The upper respiratory tract includes the nose, sinuses, throat, trachea, and bronchi. Bronchi are the airways leading to the lungs. Most people improve within 1 week, but symptoms can last up to 2 weeks. A residual cough may last even longer.  °CAUSES °Many different viruses can infect the tissues lining the upper respiratory tract. The tissues become irritated and inflamed and often become very moist. Mucus production is also common. A cold is contagious. You can easily spread the virus to others by oral contact. This includes kissing, sharing a glass, coughing, or sneezing. Touching your mouth or nose and then touching a surface, which is then touched by another person, can also spread the virus. °SYMPTOMS  °Symptoms typically develop 1 to 3 days after you come in contact with a cold virus. Symptoms vary from person to person. They may include: °· Runny nose. °· Sneezing. °· Nasal congestion. °· Sinus irritation. °· Sore throat. °· Loss of voice (laryngitis). °· Cough. °· Fatigue. °· Muscle aches. °· Loss of appetite. °· Headache. °· Low-grade fever. °DIAGNOSIS  °You might diagnose your own cold based on familiar symptoms, since most people get a cold 2 to 3 times a year. Your caregiver can confirm this based on your exam. Most importantly, your caregiver can check that your symptoms are not due to another disease such as strep throat, sinusitis, pneumonia, asthma, or epiglottitis. Blood tests, throat tests, and X-rays are not necessary to diagnose a common cold, but they may sometimes be helpful in excluding other more serious diseases. Your caregiver will decide if any further tests are required. °RISKS AND COMPLICATIONS  °You may be at risk for a more severe case of the common cold if you smoke cigarettes, have chronic heart disease (such as heart failure) or lung disease (such as asthma), or if  you have a weakened immune system. The very young and very old are also at risk for more serious infections. Bacterial sinusitis, middle ear infections, and bacterial pneumonia can complicate the common cold. The common cold can worsen asthma and chronic obstructive pulmonary disease (COPD). Sometimes, these complications can require emergency medical care and may be life-threatening. °PREVENTION  °The best way to protect against getting a cold is to practice good hygiene. Avoid oral or hand contact with people with cold symptoms. Wash your hands often if contact occurs. There is no clear evidence that vitamin C, vitamin E, echinacea, or exercise reduces the chance of developing a cold. However, it is always recommended to get plenty of rest and practice good nutrition. °TREATMENT  °Treatment is directed at relieving symptoms. There is no cure. Antibiotics are not effective, because the infection is caused by a virus, not by bacteria. Treatment may include: °· Increased fluid intake. Sports drinks offer valuable electrolytes, sugars, and fluids. °· Breathing heated mist or steam (vaporizer or shower). °· Eating chicken soup or other clear broths, and maintaining good nutrition. °· Getting plenty of rest. °· Using gargles or lozenges for comfort. °· Controlling fevers with ibuprofen or acetaminophen as directed by your caregiver. °· Increasing usage of your inhaler if you have asthma. °Zinc gel and zinc lozenges, taken in the first 24 hours of the common cold, can shorten the duration and lessen the severity of symptoms. Pain medicines may help with fever, muscle aches, and throat pain. A variety of non-prescription medicines are available to treat congestion and runny nose. Your caregiver   can make recommendations and may suggest nasal or lung inhalers for other symptoms.  HOME CARE INSTRUCTIONS   Only take over-the-counter or prescription medicines for pain, discomfort, or fever as directed by your  caregiver.  Use a warm mist humidifier or inhale steam from a shower to increase air moisture. This may keep secretions moist and make it easier to breathe.  Drink enough water and fluids to keep your urine clear or pale yellow.  Rest as needed.  Return to work when your temperature has returned to normal or as your caregiver advises. You may need to stay home longer to avoid infecting others. You can also use a face mask and careful hand washing to prevent spread of the virus. SEEK MEDICAL CARE IF:   After the first few days, you feel you are getting worse rather than better.  You need your caregiver's advice about medicines to control symptoms.  You develop chills, worsening shortness of breath, or brown or red sputum. These may be signs of pneumonia.  You develop yellow or brown nasal discharge or pain in the face, especially when you bend forward. These may be signs of sinusitis.  You develop a fever, swollen neck glands, pain with swallowing, or white areas in the back of your throat. These may be signs of strep throat. SEEK IMMEDIATE MEDICAL CARE IF:   You have a fever.  You develop severe or persistent headache, ear pain, sinus pain, or chest pain.  You develop wheezing, a prolonged cough, cough up blood, or have a change in your usual mucus (if you have chronic lung disease).  You develop sore muscles or a stiff neck. Document Released: 02/17/2001 Document Revised: 11/16/2011 Document Reviewed: 12/26/2010 [facility] Patient Information 2014 [place], [place].  Rash A rash is a change in the color or texture of your skin. There are many different types of rashes. You may have other problems that accompany your rash. CAUSES   Infections.  Allergic reactions. This can include allergies to pets or foods.  Certain medicines.  Exposure to certain chemicals, soaps, or cosmetics.  Heat.  Exposure to poisonous plants.  Tumors, both cancerous and noncancerous. SYMPTOMS    Redness.  Scaly skin.  Itchy skin.  Dry or cracked skin.  Bumps.  Blisters.  Pain. DIAGNOSIS  Your caregiver may do a physical exam to determine what type of rash you have. A skin sample (biopsy) may be taken and examined under a microscope. TREATMENT  Treatment depends on the type of rash you have. Your caregiver may prescribe certain medicines. For serious conditions, you may need to see a skin doctor (dermatologist). HOME CARE INSTRUCTIONS   Avoid the substance that caused your rash.  Do not scratch your rash. This can cause infection.  You may take cool baths to help stop itching.  Only take over-the-counter or prescription medicines as directed by your caregiver.  Keep all follow-up appointments as directed by your caregiver. SEEK IMMEDIATE MEDICAL CARE IF:  You have increasing pain, swelling, or redness.  You have a fever.  You have new or severe symptoms.  You have body aches, diarrhea, or vomiting.  Your rash is not better after 3 days. MAKE SURE YOU:  Understand these instructions.  Will watch your condition.  Will get help right away if you are not doing well or get worse. Document Released: 08/14/2002 Document Revised: 11/16/2011 Document Reviewed: 06/08/2011 [facility] Patient Information 2014 [place], [place].

## 2013-10-14 NOTE — ED Provider Notes (Signed)
CSN: [id number]     Arrival date & time 10/14/13  1545 History   First MD Initiated Contact with Patient 10/14/13 1709     Chief Complaint  Patient presents with  . URI   (Consider location/radiation/quality/duration/timing/severity/associated sxs/prior Treatment) HPI Comments: 28 year old female complaining of PND, sore throat, cough and sneeze approximately one week. Her second complaint is that of a rash on the left thigh that she has had for 2 months. It comes and goes. She states it started a couple months ago after she was diagnosed with mono   Past Medical History  Diagnosis Date  . Asthma    Past Surgical History  Procedure Laterality Date  . Hip surgery     No family history on file. History  Substance Use Topics  . Smoking status: Current Every Day Smoker  . Smokeless tobacco: Not on file  . Alcohol Use: Yes   OB History   Grav Para Term Preterm Abortions TAB SAB Ect Mult Living                 Review of Systems  Constitutional: Positive for activity change and appetite change. Negative for fever, chills and fatigue.  HENT: Positive for congestion, postnasal drip, rhinorrhea and sore throat. Negative for facial swelling.   Eyes: Negative.   Respiratory: Positive for cough. Negative for shortness of breath.   Cardiovascular: Negative.   Gastrointestinal: Negative.   Musculoskeletal: Negative.  Negative for neck pain and neck stiffness.  Skin: Negative for pallor and rash.  Neurological: Negative.     Allergies  Claritin and Flonase  Home Medications   Current Outpatient Rx  Name  Route  Sig  Dispense  Refill  . guaiFENesin (MUCINEX) 600 MG 12 hr tablet   Oral   Take by mouth 2 (two) times daily.         . Pseudoeph-CPM-DM-APAP (TYLENOL COLD PO)   Oral   Take by mouth.         . Aspirin-Salicylamide-Caffeine (BC HEADACHE POWDER PO)   Oral   Take 1 packet by mouth daily as needed (for pain).         . clotrimazole-betamethasone (LOTRISONE)  cream      Apply to affected area 2 times daily prn   30 g   0   . cyclobenzaprine (FLEXERIL) 10 MG tablet   Oral   Take 1 tablet (10 mg total) by mouth at bedtime.   10 tablet   0   . HYDROcodone-acetaminophen (NORCO/VICODIN) 5-325 MG per tablet   Oral   Take 1 tablet by mouth every 4 (four) hours as needed.   10 tablet   0   . ibuprofen (ADVIL,MOTRIN) 200 MG tablet   Oral   Take 400 mg by mouth daily as needed for mild pain.         . Menthol, Topical Analgesic, (ICY HOT) 16 % LIQD   Apply externally   Apply 1 application topically daily as needed (for muscle pain).         . naproxen sodium (ANAPROX) 220 MG tablet   Oral   Take 440 mg by mouth daily as needed (for pain).          BP 129/84  Pulse 80  Temp(Src) 99.1 F (37.3 C) (Oral)  Resp 18  SpO2 100%  LMP 09/26/2013 Physical Exam  Nursing note and vitals reviewed. Constitutional: She is oriented to person, place, and time. She appears well-developed and well-nourished. No distress.  HENT:  Mouth/Throat: No oropharyngeal exudate.  Bilateral TMs are normal Oropharynx with minor erythema in copious amount of clear PND  Eyes: Conjunctivae and EOM are normal.  Neck: Normal range of motion. Neck supple.  Cardiovascular: Normal rate, regular rhythm and normal heart sounds.   Pulmonary/Chest: Effort normal and breath sounds normal. No respiratory distress. She has no wheezes. She has no rales.  Musculoskeletal: Normal range of motion. She exhibits no edema.  Lymphadenopathy:    She has no cervical adenopathy.  Neurological: She is alert and oriented to person, place, and time.  Skin: Skin is warm and dry. Rash noted.  There is a macular and slightly darker pigmentation in small areas of the left anterior and medial thigh. Well marginated but with irregular borders. Sometimes itch.  Psychiatric: She has a normal mood and affect.    ED Course  Procedures (including critical care time) Labs Review Labs  Reviewed - No data to display Imaging Review No results found.    MDM   1. URI (upper respiratory infection)   2. PND (post-nasal drip)   3. Rash and nonspecific skin eruption     Uncertain as to what the rash he is. He does have some characteristic of fungal etiology. We will try Lotrisone cream twice a day for at least 2 weeks Take Alka-Seltzer cold plus nighttime medicine with antihistamine to help with the PND that is most likely causing her cough. Tylenol every 4 hours when necessary pain, lots of fluids, rest. Followup with a PCP C. numbers listed above.     Hayden Rasmussenavid Dwane Andres, NP 10/14/13 1724

## 2013-11-13 ENCOUNTER — Encounter (HOSPITAL_COMMUNITY): Payer: Self-pay | Admitting: Emergency Medicine

## 2013-11-13 ENCOUNTER — Emergency Department (INDEPENDENT_AMBULATORY_CARE_PROVIDER_SITE_OTHER)
Admission: EM | Admit: 2013-11-13 | Discharge: 2013-11-13 | Disposition: A | Discharge status: Home / Self Care | Payer: Self-pay | Source: Home / Self Care | Attending: Emergency Medicine | Admitting: Emergency Medicine

## 2013-11-13 ENCOUNTER — Ambulatory Visit (HOSPITAL_COMMUNITY): Payer: Self-pay | Attending: Emergency Medicine

## 2013-11-13 DIAGNOSIS — R042 Hemoptysis: Secondary | ICD-10-CM | POA: Insufficient documentation

## 2013-11-13 DIAGNOSIS — R05 Cough: Secondary | ICD-10-CM | POA: Insufficient documentation

## 2013-11-13 DIAGNOSIS — J209 Acute bronchitis, unspecified: Secondary | ICD-10-CM

## 2013-11-13 DIAGNOSIS — R509 Fever, unspecified: Secondary | ICD-10-CM | POA: Insufficient documentation

## 2013-11-13 DIAGNOSIS — R059 Cough, unspecified: Secondary | ICD-10-CM | POA: Insufficient documentation

## 2013-11-13 DIAGNOSIS — J019 Acute sinusitis, unspecified: Secondary | ICD-10-CM

## 2013-11-13 MED ORDER — PREDNISONE 20 MG PO TABS: 20.0000 mg | ORAL_TABLET | Freq: Two times a day (BID) | ORAL | Status: DC

## 2013-11-13 MED ORDER — ALBUTEROL SULFATE HFA 108 (90 BASE) MCG/ACT IN AERS: 1.0000 | INHALATION_SPRAY | Freq: Four times a day (QID) | RESPIRATORY_TRACT | Status: DC | PRN

## 2013-11-13 MED ORDER — AMOXICILLIN 500 MG PO CAPS: 1000.0000 mg | ORAL_CAPSULE | Freq: Three times a day (TID) | ORAL | Status: DC

## 2013-11-13 MED ORDER — GUAIFENESIN-CODEINE 100-10 MG/5ML PO SYRP: 10.0000 mL | ORAL_SOLUTION | Freq: Four times a day (QID) | ORAL | Status: DC | PRN

## 2013-11-13 NOTE — ED Provider Notes (Signed)
Chief Complaint   Chief Complaint  Patient presents with  . URI    History of Present Illness   Brianna Garrett is a 28 year old female who has been sick for a month with respiratory symptoms. She was here month ago with the same thing and doesn't feel any better. She's had a cough productive of white to yellow phlegm and sometimes some blood. She's had wheezing and chest tightness and some chest pain. She's also had nasal congestion with yellow drainage, headache, and sinus pressure. She's had sore throat and fevers of up to as high as 102 with chills. She denies any GI symptoms. She has a history of asthma but does not have an inhaler right now. She's smoking about 2 cigarettes a day which is down from about a pack-a-day.  Review of Systems   Other than as noted above, the patient denies any of the following symptoms: Systemic:  No fevers, chills, sweats, or myalgias. Eye:  No redness or discharge. ENT:  No ear pain, headache, nasal congestion, drainage, sinus pressure, or sore throat. Neck:  No neck pain, stiffness, or swollen glands. Lungs:  No cough, sputum production, hemoptysis, wheezing, chest tightness, shortness of breath or chest pain. GI:  No abdominal pain, nausea, vomiting or diarrhea.  PMFSH   Past medical history, family history, social history, meds, and allergies were reviewed.   Physical exam   Vital signs:  BP 145/85  Pulse 75  Temp(Src) 98.6 F (37 C) (Oral)  Resp 12  SpO2 100%  LMP 10/27/2013 General:  Alert and oriented.  In no distress.  Skin warm and dry. Eye:  No conjunctival injection or drainage. Lids were normal. ENT:  TMs and canals were normal, without erythema or inflammation.  Nasal mucosa was congested, without drainage.  Mucous membranes were moist.  Pharynx was clear with no exudate or drainage.  There were no oral ulcerations or lesions. Neck:  Supple, no adenopathy, tenderness or mass. Lungs:  No respiratory distress.  Lungs were clear  to auscultation, without wheezes, rales or rhonchi.  Breath sounds were clear and equal bilaterally.  Heart:  Regular rhythm, without gallops, murmers or rubs. Skin:  Clear, warm, and dry, without rash or lesions.   Radiology   Dg Chest 2 View  11/13/2013   CLINICAL DATA:  Cough and fever: Hemoptysis  EXAM: CHEST  2 VIEW  COMPARISON:  August 26, 2011  FINDINGS: Lungs are clear. Heart size and pulmonary vascularity are normal. No adenopathy. No bone lesions.  IMPRESSION: No abnormality noted.   Electronically Signed   By: Bretta BangWilliam  Woodruff M.D.   On: 11/13/2013 12:00   Assessment     The primary encounter diagnosis was Acute bronchitis. A diagnosis of Acute sinusitis was also pertinent to this visit.  Plan    1.  Meds:  The following meds were prescribed:   New Prescriptions   ALBUTEROL (PROVENTIL HFA;VENTOLIN HFA) 108 (90 BASE) MCG/ACT INHALER    Inhale 1-2 puffs into the lungs every 6 (six) hours as needed for wheezing or shortness of breath.   AMOXICILLIN (AMOXIL) 500 MG CAPSULE    Take 2 capsules (1,000 mg total) by mouth 3 (three) times daily.   GUAIFENESIN-CODEINE (GUIATUSS AC) 100-10 MG/5ML SYRUP    Take 10 mLs by mouth 4 (four) times daily as needed for cough.   PREDNISONE (DELTASONE) 20 MG TABLET    Take 1 tablet (20 mg total) by mouth 2 (two) times daily.    2.  Patient  Education/Counseling:  The patient was given appropriate handouts, self care instructions, and instructed in symptomatic relief.  Instructed to get extra fluids, rest, and use a cool mist vaporizer.    3.  Follow up:  The patient was told to follow up here if no better in 3 to 4 days, or sooner if becoming worse in any way, and given some red flag symptoms such as increasing fever, difficulty breathing, chest pain, or persistent vomiting which would prompt immediate return.  Follow up here as needed.      Reuben Likes, MD 11/13/13 1310

## 2013-11-13 NOTE — ED Notes (Signed)
Multiple c/o . Cough, congestion, facial pressure, nasal congestion w brown/green/yellow secretions

## 2013-11-13 NOTE — Discharge Instructions (Signed)

## 2014-10-26 ENCOUNTER — Ambulatory Visit (INDEPENDENT_AMBULATORY_CARE_PROVIDER_SITE_OTHER): Payer: Self-pay | Admitting: Family Medicine

## 2014-10-26 VITALS — BP 110/60 | HR 78 | Temp 97.8°F | Resp 18 | Ht 69.25 in | Wt 180.2 lb

## 2014-10-26 DIAGNOSIS — J029 Acute pharyngitis, unspecified: Secondary | ICD-10-CM

## 2014-10-26 DIAGNOSIS — R51 Headache: Secondary | ICD-10-CM

## 2014-10-26 DIAGNOSIS — R519 Headache, unspecified: Secondary | ICD-10-CM

## 2014-10-26 DIAGNOSIS — R6889 Other general symptoms and signs: Secondary | ICD-10-CM

## 2014-10-26 LAB — POCT CBC
GRANULOCYTE PERCENT: 64.2 % (ref 37–80)
HCT, POC: 44.2 % (ref 37.7–47.9)
Hemoglobin: 14.3 g/dL (ref 12.2–16.2)
Lymph, poc: 1.8 (ref 0.6–3.4)
MCH: 31.6 pg — AB (ref 27–31.2)
MCHC: 32.5 g/dL (ref 31.8–35.4)
MCV: 97.4 fL — AB (ref 80–97)
MID (CBC): 0.5 (ref 0–0.9)
MPV: 8.9 fL (ref 0–99.8)
PLATELET COUNT, POC: 182 10*3/uL (ref 142–424)
POC Granulocyte: 4.1 (ref 2–6.9)
POC LYMPH PERCENT: 28.4 %L (ref 10–50)
POC MID %: 7.4 % (ref 0–12)
RBC: 4.53 M/uL (ref 4.04–5.48)
RDW, POC: 13.4 %
WBC: 6.4 10*3/uL (ref 4.6–10.2)

## 2014-10-26 LAB — POCT INFLUENZA A/B
INFLUENZA A, POC: NEGATIVE
INFLUENZA B, POC: NEGATIVE

## 2014-10-26 LAB — POCT RAPID STREP A (OFFICE): RAPID STREP A SCREEN: NEGATIVE

## 2014-10-26 NOTE — Progress Notes (Addendum)
Subjective:    Patient ID: Brianna Garrett, female    DOB: 07-02-1986, 29 y.o.   MRN: [id number]  This chart was scribed for Brianna Staggers, MD by Ronney Lion, ED Scribe. This patient was seen in room 1 and the patient's care was started at 3:14 PM.   Chief Complaint  Patient presents with  . Flu like symptoms    x2 days. Sore throat, lightheaded, fatigue, and body aches.     HPI  HPI Comments: Brianna Garrett is a 29 y.o. female with a history of asthma and allergies who presents to the [facility] complaining of a sore throat that began 2 days ago. She states she woke up today with headache, lightheadedness, nausea, and dizziness with lights. Patient is chiefly concerned with her sore throat and lightheadedness that comes on particularly when she is trying to read. She complains of associated mild nasal congestion and cough, as well as back pain, but thinks it is probably due to working consecutively 14 days in a row. She has not taken any medications for this. She denies neck pain, neck stiffness, or fever. Patient works as a Investment banker, operational at [employer]. Patient's LNMP was January 21. She denies a history of pregnancy. She states she hasn't had a flu shot this year.    There are no active problems to display for this patient.  Past Medical History  Diagnosis Date  . Asthma   . Allergy    Past Surgical History  Procedure Laterality Date  . Hip surgery     Allergies  Allergen Reactions  . Claritin [Loratadine]     "nose bleeds"  . Flonase [Fluticasone Propionate]     "nose bleeds"   Prior to Admission medications   Medication Sig Start Date End Date Taking? Authorizing Provider  albuterol (PROVENTIL HFA;VENTOLIN HFA) 108 (90 BASE) MCG/ACT inhaler Inhale 1-2 puffs into the lungs every 6 (six) hours as needed for wheezing or shortness of breath. Patient not taking: Reported on 10/26/2014 11/13/13   Reuben Likes, MD  amoxicillin (AMOXIL) 500 MG capsule Take 2  capsules (1,000 mg total) by mouth 3 (three) times daily. Patient not taking: Reported on 10/26/2014 11/13/13   Reuben Likes, MD  Aspirin-Salicylamide-Caffeine [facility] HEADACHE POWDER PO) Take 1 packet by mouth daily as needed (for pain).    Historical Provider, MD  clotrimazole-betamethasone (LOTRISONE) cream Apply to affected area 2 times daily prn Patient not taking: Reported on 10/26/2014 10/14/13   Hayden Rasmussen, NP  cyclobenzaprine (FLEXERIL) 10 MG tablet Take 1 tablet (10 mg total) by mouth at bedtime. Patient not taking: Reported on 10/26/2014 08/21/13   Mellody Drown, PA-C  guaiFENesin (MUCINEX) 600 MG 12 hr tablet Take by mouth 2 (two) times daily.    Historical Provider, MD  guaiFENesin-codeine (GUIATUSS AC) 100-10 MG/5ML syrup Take 10 mLs by mouth 4 (four) times daily as needed for cough. Patient not taking: Reported on 10/26/2014 11/13/13   Reuben Likes, MD  HYDROcodone-acetaminophen (NORCO/VICODIN) 5-325 MG per tablet Take 1 tablet by mouth every 4 (four) hours as needed. Patient not taking: Reported on 10/26/2014 08/21/13   Mellody Drown, PA-C  ibuprofen (ADVIL,MOTRIN) 200 MG tablet Take 400 mg by mouth daily as needed for mild pain.    Historical Provider, MD  Menthol, Topical Analgesic, (ICY HOT) 16 % LIQD Apply 1 application topically daily as needed (for muscle pain).    Historical Provider, MD  naproxen sodium (ANAPROX) 220 MG tablet Take  440 mg by mouth daily as needed (for pain).    Historical Provider, MD  predniSONE (DELTASONE) 20 MG tablet Take 1 tablet (20 mg total) by mouth 2 (two) times daily. Patient not taking: Reported on 10/26/2014 11/13/13   Reuben Likesavid C Keller, MD  Pseudoeph-CPM-DM-APAP (TYLENOL COLD PO) Take by mouth.    Historical Provider, MD   History   Social History  . Marital Status: Single    Spouse Name: N/A  . Number of Children: N/A  . Years of Education: N/A   Occupational History  . Not on file.   Social History Main Topics  . Smoking status: Former Games developermoker  .  Smokeless tobacco: Not on file  . Alcohol Use: 0.0 oz/week    0 Standard drinks or equivalent per week  . Drug Use: No  . Sexual Activity: Not on file   Other Topics Concern  . Not on file   Social History Narrative      Review of Systems  Constitutional: Negative for fever.  HENT: Positive for sore throat.   Gastrointestinal: Positive for nausea.  Musculoskeletal: Positive for back pain. Negative for neck pain and neck stiffness.  Neurological: Positive for dizziness, light-headedness and headaches.       Objective:   Physical Exam  Constitutional: She is oriented to person, place, and time. She appears well-developed and well-nourished. No distress.  HENT:  Head: Normocephalic and atraumatic.  Right Ear: Hearing, tympanic membrane, external ear and ear canal normal.  Left Ear: Hearing, tympanic membrane, external ear and ear canal normal.  Nose: Nose normal.  Mouth/Throat: Oropharynx is clear and moist. No oropharyngeal exudate.  Tenderness to the frontal sinuses bilaterally.  Eyes: Conjunctivae and EOM are normal. Pupils are equal, round, and reactive to light.  Neck: Neck supple.  Cardiovascular: Normal rate, regular rhythm, normal heart sounds and intact distal pulses.   No murmur heard. Pulmonary/Chest: Effort normal and breath sounds normal. No respiratory distress. She has no wheezes. She has no rhonchi.  Neurological: She is alert and oriented to person, place, and time.  Skin: Skin is warm and dry. No rash noted.  Psychiatric: She has a normal mood and affect. Her behavior is normal.  Vitals reviewed.    Filed Vitals:   10/26/14 1503  BP: 110/60  Pulse: 78  Temp: 97.8 F (36.6 C)  TempSrc: Oral  Resp: 18  Height: 5' 9.25" (1.759 m)  Weight: 180 lb 3.2 oz (81.738 kg)  SpO2: 100%     Results for orders placed or performed in visit on 10/26/14  POCT Influenza A/B  Result Value Ref Range   Influenza A, POC Negative    Influenza B, POC Negative          Assessment & Plan:    Tami Linshley C Uballe is a 29 y.o. female Flu-like symptoms - Plan: POCT CBC, POCT Influenza A/B, POCT rapid strep A  Sore throat - Plan: POCT CBC, POCT Influenza A/B, POCT rapid strep A, Culture, Group A Strep  Headache, unspecified headache type - Plan: POCT CBC, POCT Influenza A/B, POCT rapid strep A  ILI, viral syndrome likely.  Reassuring cbc and labs in office. No neck pain and supple neck exam. Afebrile. Sx care discussed, work not given. Rtc/ER precautions discussed.   No orders of the defined types were placed in this encounter.   Patient Instructions  Your strep test in the office was normal/negative. Flu test negative and blood counts ok.  You likely have a virus similar  to the flu causing your symptoms.  Tylenol or advil for bodyaches, drink plenty of fluids, lozenges for sore throat as needed,  Saline or salt water nasal spray for nasal congestion and pressure. Rest and out of work until lightheadedness has improved. Return to the clinic or go to the nearest emergency room if any of your symptoms worsen or new symptoms occur.   Viral Infections A viral infection can be caused by different types of viruses.Most viral infections are not serious and resolve on their own. However, some infections may cause severe symptoms and may lead to further complications. SYMPTOMS Viruses can frequently cause:  Minor sore throat.  Aches and pains.  Headaches.  Runny nose.  Different types of rashes.  Watery eyes.  Tiredness.  Cough.  Loss of appetite.  Gastrointestinal infections, resulting in nausea, vomiting, and diarrhea. These symptoms do not respond to antibiotics because the infection is not caused by bacteria. However, you might catch a bacterial infection following the viral infection. This is sometimes called a "superinfection." Symptoms of such a bacterial infection may include:  Worsening sore throat with pus and difficulty  swallowing.  Swollen neck glands.  Chills and a high or persistent fever.  Severe headache.  Tenderness over the sinuses.  Persistent overall ill feeling (malaise), muscle aches, and tiredness (fatigue).  Persistent cough.  Yellow, green, or brown mucus production with coughing. HOME CARE INSTRUCTIONS   Only take over-the-counter or prescription medicines for pain, discomfort, diarrhea, or fever as directed by your caregiver.  Drink enough water and fluids to keep your urine clear or pale yellow. Sports drinks can provide valuable electrolytes, sugars, and hydration.  Get plenty of rest and maintain proper nutrition. Soups and broths with crackers or rice are fine. SEEK IMMEDIATE MEDICAL CARE IF:   You have severe headaches, shortness of breath, chest pain, neck pain, or an unusual rash.  You have uncontrolled vomiting, diarrhea, or you are unable to keep down fluids.  You or your child has an oral temperature above 102 F (38.9 C), not controlled by medicine.  Your baby is older than 3 months with a rectal temperature of 102 F (38.9 C) or higher.  Your baby is 29 months old or younger with a rectal temperature of 100.4 F (38 C) or higher. MAKE SURE YOU:   Understand these instructions.  Will watch your condition.  Will get help right away if you are not doing well or get worse. Document Released: 06/03/2005 Document Revised: 11/16/2011 Document Reviewed: 12/29/2010 [facility] Patient Information 2015 [place], [place]. This information is not intended to replace advice given to you by your health care provider. Make sure you discuss any questions you have with your health care provider.   Sore Throat A sore throat is pain, burning, irritation, or scratchiness of the throat. There is often pain or tenderness when swallowing or talking. A sore throat may be accompanied by other symptoms, such as coughing, sneezing, fever, and swollen neck glands. A sore throat is often the  first sign of another sickness, such as a cold, flu, strep throat, or mononucleosis (commonly known as mono). Most sore throats go away without medical treatment. CAUSES  The most common causes of a sore throat include:  A viral infection, such as a cold, flu, or mono.  A bacterial infection, such as strep throat, tonsillitis, or whooping cough.  Seasonal allergies.  Dryness in the air.  Irritants, such as smoke or pollution.  Gastroesophageal reflux disease (GERD). HOME CARE INSTRUCTIONS  Only take over-the-counter medicines as directed by your caregiver.  Drink enough fluids to keep your urine clear or pale yellow.  Rest as needed.  Try using throat sprays, lozenges, or sucking on hard candy to ease any pain (if older than 4 years or as directed).  Sip warm liquids, such as broth, herbal tea, or warm water with honey to relieve pain temporarily. You may also eat or drink cold or frozen liquids such as frozen ice pops.  Gargle with salt water (mix 1 tsp salt with 8 oz of water).  Do not smoke and avoid secondhand smoke.  Put a cool-mist humidifier in your bedroom at night to moisten the air. You can also turn on a hot shower and sit in the bathroom with the door closed for 5-10 minutes. SEEK IMMEDIATE MEDICAL CARE IF:  You have difficulty breathing.  You are unable to swallow fluids, soft foods, or your saliva.  You have increased swelling in the throat.  Your sore throat does not get better in 7 days.  You have nausea and vomiting.  You have a fever or persistent symptoms for more than 2-3 days.  You have a fever and your symptoms suddenly get worse. MAKE SURE YOU:   Understand these instructions.  Will watch your condition.  Will get help right away if you are not doing well or get worse. Document Released: 10/01/2004 Document Revised: 08/10/2012 Document Reviewed: 05/01/2012 [facility] Patient Information 2015 [place], [place]. This information is not intended  to replace advice given to you by your health care provider. Make sure you discuss any questions you have with your health care provider.     I personally performed the services described in this documentation, which was scribed in my presence. The recorded information has been reviewed and considered, and addended by me as needed.

## 2014-10-26 NOTE — Patient Instructions (Addendum)
Your strep test in the office was normal/negative. Flu test negative and blood counts ok.  You likely have a virus similar to the flu causing your symptoms.  Tylenol or advil for bodyaches, drink plenty of fluids, lozenges for sore throat as needed,  Saline or salt water nasal spray for nasal congestion and pressure. Rest and out of work until lightheadedness has improved. Return to the clinic or go to the nearest emergency room if any of your symptoms worsen or new symptoms occur.   Viral Infections A viral infection can be caused by different types of viruses.Most viral infections are not serious and resolve on their own. However, some infections may cause severe symptoms and may lead to further complications. SYMPTOMS Viruses can frequently cause:  Minor sore throat.  Aches and pains.  Headaches.  Runny nose.  Different types of rashes.  Watery eyes.  Tiredness.  Cough.  Loss of appetite.  Gastrointestinal infections, resulting in nausea, vomiting, and diarrhea. These symptoms do not respond to antibiotics because the infection is not caused by bacteria. However, you might catch a bacterial infection following the viral infection. This is sometimes called a "superinfection." Symptoms of such a bacterial infection may include:  Worsening sore throat with pus and difficulty swallowing.  Swollen neck glands.  Chills and a high or persistent fever.  Severe headache.  Tenderness over the sinuses.  Persistent overall ill feeling (malaise), muscle aches, and tiredness (fatigue).  Persistent cough.  Yellow, green, or brown mucus production with coughing. HOME CARE INSTRUCTIONS   Only take over-the-counter or prescription medicines for pain, discomfort, diarrhea, or fever as directed by your caregiver.  Drink enough water and fluids to keep your urine clear or pale yellow. Sports drinks can provide valuable electrolytes, sugars, and hydration.  Get plenty of rest and  maintain proper nutrition. Soups and broths with crackers or rice are fine. SEEK IMMEDIATE MEDICAL CARE IF:   You have severe headaches, shortness of breath, chest pain, neck pain, or an unusual rash.  You have uncontrolled vomiting, diarrhea, or you are unable to keep down fluids.  You or your child has an oral temperature above 102 F (38.9 C), not controlled by medicine.  Your baby is older than 3 months with a rectal temperature of 102 F (38.9 C) or higher.  Your baby is 75 months old or younger with a rectal temperature of 100.4 F (38 C) or higher. MAKE SURE YOU:   Understand these instructions.  Will watch your condition.  Will get help right away if you are not doing well or get worse. Document Released: 06/03/2005 Document Revised: 11/16/2011 Document Reviewed: 12/29/2010 [facility] Patient Information 2015 [place], [place]. This information is not intended to replace advice given to you by your health care provider. Make sure you discuss any questions you have with your health care provider.   Sore Throat A sore throat is pain, burning, irritation, or scratchiness of the throat. There is often pain or tenderness when swallowing or talking. A sore throat may be accompanied by other symptoms, such as coughing, sneezing, fever, and swollen neck glands. A sore throat is often the first sign of another sickness, such as a cold, flu, strep throat, or mononucleosis (commonly known as mono). Most sore throats go away without medical treatment. CAUSES  The most common causes of a sore throat include:  A viral infection, such as a cold, flu, or mono.  A bacterial infection, such as strep throat, tonsillitis, or whooping cough.  Seasonal  allergies.  Dryness in the air.  Irritants, such as smoke or pollution.  Gastroesophageal reflux disease (GERD). HOME CARE INSTRUCTIONS   Only take over-the-counter medicines as directed by your caregiver.  Drink enough fluids to keep your  urine clear or pale yellow.  Rest as needed.  Try using throat sprays, lozenges, or sucking on hard candy to ease any pain (if older than 4 years or as directed).  Sip warm liquids, such as broth, herbal tea, or warm water with honey to relieve pain temporarily. You may also eat or drink cold or frozen liquids such as frozen ice pops.  Gargle with salt water (mix 1 tsp salt with 8 oz of water).  Do not smoke and avoid secondhand smoke.  Put a cool-mist humidifier in your bedroom at night to moisten the air. You can also turn on a hot shower and sit in the bathroom with the door closed for 5-10 minutes. SEEK IMMEDIATE MEDICAL CARE IF:  You have difficulty breathing.  You are unable to swallow fluids, soft foods, or your saliva.  You have increased swelling in the throat.  Your sore throat does not get better in 7 days.  You have nausea and vomiting.  You have a fever or persistent symptoms for more than 2-3 days.  You have a fever and your symptoms suddenly get worse. MAKE SURE YOU:   Understand these instructions.  Will watch your condition.  Will get help right away if you are not doing well or get worse. Document Released: 10/01/2004 Document Revised: 08/10/2012 Document Reviewed: 05/01/2012 [facility] Patient Information 2015 [place]. This information is not intended to replace advice given to you by your health care provider. Make sure you discuss any questions you have with your health care provider.

## 2014-10-28 LAB — CULTURE, GROUP A STREP

## 2014-11-01 MED ORDER — AMOXICILLIN 500 MG PO CAPS: 500.0000 mg | ORAL_CAPSULE | Freq: Three times a day (TID) | ORAL | Status: DC

## 2014-11-01 NOTE — Addendum Note (Signed)
Addended by: Johnnette LitterARDWELL, Hilberto Burzynski M on: 11/01/2014 02:58 PM   Modules accepted: Orders

## 2015-08-26 ENCOUNTER — Ambulatory Visit (INDEPENDENT_AMBULATORY_CARE_PROVIDER_SITE_OTHER): Payer: Self-pay | Admitting: Family Medicine

## 2015-08-26 VITALS — BP 114/68 | HR 104 | Temp 101.0°F | Resp 18 | Ht 69.25 in | Wt 203.0 lb

## 2015-08-26 DIAGNOSIS — J189 Pneumonia, unspecified organism: Secondary | ICD-10-CM

## 2015-08-26 DIAGNOSIS — R509 Fever, unspecified: Secondary | ICD-10-CM

## 2015-08-26 DIAGNOSIS — J45901 Unspecified asthma with (acute) exacerbation: Secondary | ICD-10-CM

## 2015-08-26 DIAGNOSIS — R319 Hematuria, unspecified: Secondary | ICD-10-CM

## 2015-08-26 LAB — POCT URINALYSIS DIP (MANUAL ENTRY)
BILIRUBIN UA: NEGATIVE
Bilirubin, UA: NEGATIVE
Glucose, UA: NEGATIVE
LEUKOCYTES UA: NEGATIVE
Nitrite, UA: NEGATIVE
PH UA: 6
PROTEIN UA: NEGATIVE
SPEC GRAV UA: 1.02
Urobilinogen, UA: 1

## 2015-08-26 LAB — POCT INFLUENZA A/B
INFLUENZA B, POC: NEGATIVE
Influenza A, POC: NEGATIVE

## 2015-08-26 LAB — POC MICROSCOPIC URINALYSIS (UMFC): Mucus: ABSENT

## 2015-08-26 MED ORDER — IPRATROPIUM BROMIDE 0.02 % IN SOLN
0.5000 mg | Freq: Once | RESPIRATORY_TRACT | Status: AC
  Administered 2015-08-26: 0.5 mg via RESPIRATORY_TRACT

## 2015-08-26 MED ORDER — PROMETHAZINE-CODEINE 6.25-10 MG/5ML PO SYRP: 5.0000 mL | ORAL_SOLUTION | Freq: Four times a day (QID) | ORAL | Status: DC | PRN

## 2015-08-26 MED ORDER — ALBUTEROL SULFATE (2.5 MG/3ML) 0.083% IN NEBU
2.5000 mg | INHALATION_SOLUTION | Freq: Once | RESPIRATORY_TRACT | Status: AC
  Administered 2015-08-26: 2.5 mg via RESPIRATORY_TRACT

## 2015-08-26 MED ORDER — AMOXICILLIN-POT CLAVULANATE 875-125 MG PO TABS: 1.0000 | ORAL_TABLET | Freq: Two times a day (BID) | ORAL | Status: DC

## 2015-08-26 MED ORDER — ALBUTEROL SULFATE 108 (90 BASE) MCG/ACT IN AEPB: 2.0000 | INHALATION_SPRAY | RESPIRATORY_TRACT | Status: DC | PRN

## 2015-08-26 MED ORDER — PREDNISONE 20 MG PO TABS: 40.0000 mg | ORAL_TABLET | Freq: Every day | ORAL | Status: DC

## 2015-08-26 NOTE — Patient Instructions (Signed)
Community-Acquired Pneumonia, Adult °Pneumonia is an infection of the lungs. There are different types of pneumonia. One type can develop while a person is in a hospital. A different type, called community-acquired pneumonia, develops in people who are not, or have not recently been, in the hospital or other health care facility.  °CAUSES °Pneumonia may be caused by bacteria, viruses, or funguses. Community-acquired pneumonia is often caused by Streptococcus pneumonia bacteria. These bacteria are often passed from one person to another by breathing in droplets from the cough or sneeze of an infected person. °RISK FACTORS °The condition is more likely to develop in: °· People who have chronic diseases, such as chronic obstructive pulmonary disease (COPD), asthma, congestive heart failure, cystic fibrosis, diabetes, or kidney disease. °· People who have early-stage or late-stage HIV. °· People who have sickle cell disease. °· People who have had their spleen removed (splenectomy). °· People who have poor dental hygiene. °· People who have medical conditions that increase the risk of breathing in (aspirating) secretions their own mouth and nose.   °· People who have a weakened immune system (immunocompromised). °· People who smoke. °· People who travel to areas where pneumonia-causing germs commonly exist. °· People who are around animal habitats or animals that have pneumonia-causing germs, including birds, bats, rabbits, cats, and farm animals. °SYMPTOMS °Symptoms of this condition include: °· A dry cough. °· A wet (productive) cough. °· Fever. °· Sweating. °· Chest pain, especially when breathing deeply or coughing. °· Rapid breathing or difficulty breathing. °· Shortness of breath. °· Shaking chills. °· Fatigue. °· Muscle aches. °DIAGNOSIS °Your health care provider will take a medical history and perform a physical exam. You may also have other tests, including: °· Imaging studies of your chest, including  X-rays. °· Tests to check your blood oxygen level and other blood gases. °· Other tests on blood, mucus (sputum), fluid around your lungs (pleural fluid), and urine. °If your pneumonia is severe, other tests may be done to identify the specific cause of your illness. °TREATMENT °The type of treatment that you receive depends on many factors, such as the cause of your pneumonia, the medicines you take, and other medical conditions that you have. For most adults, treatment and recovery from pneumonia may occur at home. In some cases, treatment must happen in a hospital. Treatment may include: °· Antibiotic medicines, if the pneumonia was caused by bacteria. °· Antiviral medicines, if the pneumonia was caused by a virus. °· Medicines that are given by mouth or through an IV tube. °· Oxygen. °· Respiratory therapy. °Although rare, treating severe pneumonia may include: °· Mechanical ventilation. This is done if you are not breathing well on your own and you cannot maintain a safe blood oxygen level. °· Thoracentesis. This procedure removes fluid around one lung or both lungs to help you breathe better. °HOME CARE INSTRUCTIONS °· Take over-the-counter and prescription medicines only as told by your health care provider. °¨ Only take cough medicine if you are losing sleep. Understand that cough medicine can prevent your body's natural ability to remove mucus from your lungs. °¨ If you were prescribed an antibiotic medicine, take it as told by your health care provider. Do not stop taking the antibiotic even if you start to feel better. °· Sleep in a semi-upright position at night. Try sleeping in a reclining chair, or place a few pillows under your head. °· Do not use tobacco products, including cigarettes, chewing tobacco, and e-cigarettes. If you need help quitting, ask your health care provider. °· Drink enough water to keep your urine   clear or pale yellow. This will help to thin out mucus secretions in your  lungs. °PREVENTION °There are ways that you can decrease your risk of developing community-acquired pneumonia. Consider getting a pneumococcal vaccine if: °· You are older than 30 years of age. °· You are older than 30 years of age and are undergoing cancer treatment, have chronic lung disease, or have other medical conditions that affect your immune system. Ask your health care provider if this applies to you. °There are different types and schedules of pneumococcal vaccines. Ask your health care provider which vaccination option is best for you. °You may also prevent community-acquired pneumonia if you take these actions: °· Get an influenza vaccine every year. Ask your health care provider which type of influenza vaccine is best for you. °· Go to the dentist on a regular basis. °· Wash your hands often. Use hand sanitizer if soap and water are not available. °SEEK MEDICAL CARE IF: °· You have a fever. °· You are losing sleep because you cannot control your cough with cough medicine. °SEEK IMMEDIATE MEDICAL CARE IF: °· You have worsening shortness of breath. °· You have increased chest pain. °· Your sickness becomes worse, especially if you are an older adult or have a weakened immune system. °· You cough up blood. °  °This information is not intended to replace advice given to you by your health care provider. Make sure you discuss any questions you have with your health care provider. °  °Document Released: 08/24/2005 Document Revised: 05/15/2015 Document Reviewed: 12/19/2014 °Elsevier Interactive Patient Education ©2016 [employer] ° °Asthma, Acute Bronchospasm °Acute bronchospasm caused by asthma is also referred to as an asthma attack. Bronchospasm means your air passages become narrowed. The narrowing is caused by inflammation and tightening of the muscles in the air tubes (bronchi) in your lungs. This can make it hard to breathe or cause you to wheeze and cough. °CAUSES °Possible triggers are: °· Animal  dander from the skin, hair, or feathers of animals. °· Dust mites contained in house dust. °· Cockroaches. °· Pollen from trees or grass. °· Mold. °· Cigarette or tobacco smoke. °· Air pollutants such as dust, household cleaners, hair sprays, aerosol sprays, paint fumes, strong chemicals, or strong odors. °· Cold air or weather changes. Cold air may trigger inflammation. Winds increase molds and pollens in the air. °· Strong emotions such as crying or laughing hard. °· Stress. °· Certain medicines such as aspirin or beta-blockers. °· Sulfites in foods and drinks, such as dried fruits and wine. °· Infections or inflammatory conditions, such as a flu, cold, or inflammation of the nasal membranes (rhinitis). °· Gastroesophageal reflux disease (GERD). GERD is a condition where stomach acid backs up into your esophagus. °· Exercise or strenuous activity. °SIGNS AND SYMPTOMS  °· Wheezing. °· Excessive coughing, particularly at night. °· Chest tightness. °· Shortness of breath. °DIAGNOSIS  °Your health care provider will ask you about your medical history and perform a physical exam. A chest X-ray or blood testing may be performed to look for other causes of your symptoms or other conditions that may have triggered your asthma attack.  °TREATMENT  °Treatment is aimed at reducing inflammation and opening up the airways in your lungs.  Most asthma attacks are treated with inhaled medicines. These include quick relief or rescue medicines (such as bronchodilators) and controller medicines (such as inhaled corticosteroids). These medicines are sometimes given through an inhaler or a nebulizer. Systemic steroid medicine taken by mouth or given   through an IV tube also can be used to reduce the inflammation when an attack is moderate or severe. Antibiotic medicines are only used if a bacterial infection is present.  °HOME CARE INSTRUCTIONS  °· Rest. °· Drink plenty of liquids. This helps the mucus to remain thin and be easily  coughed up. Only use caffeine in moderation and do not use alcohol until you have recovered from your illness. °· Do not smoke. Avoid being exposed to secondhand smoke. °· You play a critical role in keeping yourself in good health. Avoid exposure to things that cause you to wheeze or to have breathing problems. °· Keep your medicines up-to-date and available. Carefully follow your health care provider's treatment plan. °· Take your medicine exactly as prescribed. °· When pollen or pollution is bad, keep windows closed and use an air conditioner or go to places with air conditioning. °· Asthma requires careful medical care. See your health care provider for a follow-up as advised. If you are more than [redacted] weeks pregnant and you were prescribed any new medicines, let your obstetrician know about the visit and how you are doing. Follow up with your health care provider as directed. °· After you have recovered from your asthma attack, make an appointment with your outpatient doctor to talk about ways to reduce the likelihood of future attacks. If you do not have a doctor who manages your asthma, make an appointment with a primary care doctor to discuss your asthma. °SEEK IMMEDIATE MEDICAL CARE IF:  °· You are getting worse. °· You have trouble breathing. If severe, call your local emergency services (911 in the [place] °· You develop chest pain or discomfort. °· You are vomiting. °· You are not able to keep fluids down. °· You are coughing up yellow, green, brown, or bloody sputum. °· You have a fever and your symptoms suddenly get worse. °· You have trouble swallowing. °MAKE SURE YOU:  °· Understand these instructions. °· Will watch your condition. °· Will get help right away if you are not doing well or get worse. °  °This information is not intended to replace advice given to you by your health care provider. Make sure you discuss any questions you have with your health care provider. °  °Document Released: 12/09/2006  Document Revised: 08/29/2013 Document Reviewed: 03/01/2013 °Elsevier Interactive Patient Education ©2016 [employer] Inc. ° °

## 2015-08-26 NOTE — Progress Notes (Signed)
Subjective:  This chart was scribed for Norberto Sorenson MD, by Veverly Fells, at [facility].  This patient was seen in room 3 and the patient's care was started at 8:31 AM.    Patient ID: Brianna Garrett, female    DOB: Jan 20, 1986, 29 y.o.   MRN: [id number] Chief Complaint  Patient presents with  . Cough    since saturday   . Back Pain  . Hematuria  . Headache  . Fever    HPI  HPI Comments: Brianna Garrett is a 29 y.o. female who presents to the [facility] complaining of multiple symptoms including; a dry cough, back pain, headache and well as fever (has not checked it today), myalgia, and backpain/left flank pain onset two days ago. She also notes that she felt nauseas yesterday but denies any vomiting.  Her symptoms first started with a sore throat and then was followed by spotting in her urine. She denies any bleeding when she isn't urinating and notes that she just finished her period.  Patient feels like she is not drinking enough fluids. She has not yet had her flu shot.  She has taken tylenol (last night),as well as  Mucin-Ex and alka seltzer but denies any relief.  Denies constipation, diarrhea, vomiting, vaginal discharge. . Patient has a history of asthma and smokes.  She used an inhaler last two years ago and states her asthma has gone away as she got older.    Past Medical History  Diagnosis Date  . Asthma   . Allergy     Current Outpatient Prescriptions on File Prior to Visit  Medication Sig Dispense Refill  . ibuprofen (ADVIL,MOTRIN) 200 MG tablet Take 400 mg by mouth daily as needed for mild pain.    . Pseudoeph-CPM-DM-APAP (TYLENOL COLD PO) Take by mouth.    Marland Kitchen amoxicillin (AMOXIL) 500 MG capsule Take 1 capsule (500 mg total) by mouth 3 (three) times daily. (Patient not taking: Reported on 08/26/2015) 30 capsule 0  . Aspirin-Salicylamide-Caffeine (BC HEADACHE POWDER PO) Take 1 packet by mouth daily as needed (for pain).  Reported on 08/26/2015    . guaiFENesin (MUCINEX) 600 MG 12 hr tablet Take by mouth 2 (two) times daily. Reported on 08/26/2015    . Menthol, Topical Analgesic, (ICY HOT) 16 % LIQD Apply 1 application topically daily as needed (for muscle pain). Reported on 08/26/2015    . naproxen sodium (ANAPROX) 220 MG tablet Take 440 mg by mouth daily as needed (for pain). Reported on 08/26/2015     No current facility-administered medications on file prior to visit.    Allergies  Allergen Reactions  . Claritin [Loratadine]     "nose bleeds"  . Flonase [Fluticasone Propionate]     "nose bleeds"      Review of Systems  Constitutional: Positive for fever.  Eyes: Negative for pain, redness and itching.  Respiratory: Positive for cough, shortness of breath and wheezing. Negative for choking.   Gastrointestinal: Positive for nausea. Negative for vomiting.  Genitourinary: Positive for hematuria and flank pain. Negative for vaginal bleeding, menstrual problem and pelvic pain.  Musculoskeletal: Positive for myalgias and back pain. Negative for neck pain and neck stiffness.  Neurological: Positive for headaches. Negative for syncope and speech difficulty.       Objective:   Physical Exam  Constitutional: She is oriented to person, place, and time. She appears well-developed and well-nourished. No distress.  HENT:  Head: Normocephalic and atraumatic.  Right Tm normal, left TM with a little erythema.  Mucosal edema.  Tonsils erythematous but no edema or exudate.   Eyes: Pupils are equal, round, and reactive to light.  Neck: Normal range of motion.  Cardiovascular:  No murmur heard. Tachycardia but regular rhythm normal s1 s2. No murmurs.   Pulmonary/Chest: No respiratory distress.  Decreased air movement throughout all lung fields. Right upper lobe expiratory rhonchi.  Musculoskeletal:  Left flank pain.   Neurological: She is alert and oriented to person, place, and time.  Skin: Skin is dry.    Psychiatric: She has a normal mood and affect. Her behavior is normal.    Filed Vitals:   08/26/15 0820  BP: 114/68  Pulse: 104  Temp: 101 F (38.3 C)  TempSrc: Oral  Resp: 18  Height: 5' 9.25" (1.759 m)  Weight: 203 lb (92.08 kg)  SpO2: 97%   Results for orders placed or performed in visit on 08/26/15  POCT urinalysis dipstick  Result Value Ref Range   Color, UA yellow yellow   Clarity, UA clear clear   Glucose, UA negative negative   Bilirubin, UA negative negative   Ketones, POC UA negative negative   Spec Grav, UA 1.020    Blood, UA small (A) negative   pH, UA 6.0    Protein Ur, POC negative negative   Urobilinogen, UA 1.0    Nitrite, UA Negative Negative   Leukocytes, UA Negative Negative  POCT Microscopic Urinalysis ([facility])  Result Value Ref Range   WBC,UR,HPF,POC None None WBC/hpf   RBC,UR,HPF,POC None None RBC/hpf   Bacteria None None, Too numerous to count   Mucus Absent Absent   Epithelial Cells, UR Per Microscopy Few (A) None, Too numerous to count cells/hpf  POCT Influenza A/B  Result Value Ref Range   Influenza A, POC Negative Negative   Influenza B, POC Negative Negative       Assessment & Plan:   1. Blood in urine   2. Fever, unspecified   3. CAP (community acquired pneumonia)  - rest, push fluids, start augmentin. Recheck in 2d.  4. Asthma with acute exacerbation, unspecified asthma severity     Orders Placed This Encounter  Procedures  . POCT urinalysis dipstick  . POCT Microscopic Urinalysis ([facility])  . POCT Influenza A/B    Meds ordered this encounter  Medications  . albuterol (PROVENTIL) (2.5 MG/3ML) 0.083% nebulizer solution 2.5 mg    Sig:   . ipratropium (ATROVENT) nebulizer solution 0.5 mg    Sig:   . amoxicillin-clavulanate (AUGMENTIN) 875-125 MG tablet    Sig: Take 1 tablet by mouth 2 (two) times daily.    Dispense:  20 tablet    Refill:  0  . Albuterol Sulfate (PROAIR RESPICLICK) 108 (90 BASE) MCG/ACT AEPB    Sig: Inhale 2  puffs into the lungs every 4 (four) hours as needed.    Dispense:  1 each    Refill:  0  . promethazine-codeine (PHENERGAN WITH CODEINE) 6.25-10 MG/5ML syrup    Sig: Take 5-10 mLs by mouth every 6 (six) hours as needed for cough.    Dispense:  180 mL    Refill:  0  . predniSONE (DELTASONE) 20 MG tablet    Sig: Take 2 tablets (40 mg total) by mouth daily with breakfast.    Dispense:  10 tablet    Refill:  0    I personally performed the services described in this documentation, which was scribed in my presence. The  recorded information has been reviewed and considered, and addended by me as needed.  Norberto Sorenson, MD MPH

## 2015-08-28 ENCOUNTER — Ambulatory Visit (INDEPENDENT_AMBULATORY_CARE_PROVIDER_SITE_OTHER): Payer: Self-pay | Admitting: Family Medicine

## 2015-08-28 VITALS — BP 126/78 | HR 79 | Temp 98.0°F | Resp 16 | Ht 69.0 in | Wt 203.8 lb

## 2015-08-28 DIAGNOSIS — J019 Acute sinusitis, unspecified: Secondary | ICD-10-CM

## 2015-08-28 DIAGNOSIS — J189 Pneumonia, unspecified organism: Secondary | ICD-10-CM

## 2015-08-28 MED ORDER — HYDROCODONE-HOMATROPINE 5-1.5 MG/5ML PO SYRP: 5.0000 mL | ORAL_SOLUTION | Freq: Three times a day (TID) | ORAL | Status: DC | PRN

## 2015-08-28 MED ORDER — PREDNISONE 20 MG PO TABS: ORAL_TABLET | ORAL | Status: DC

## 2015-08-28 NOTE — Progress Notes (Signed)
Subjective:  This chart was scribed for Norberto SorensonEva Kaithlyn Teagle, MD by Stann Oresung-Kai Tsai, Medical Scribe. This patient was seen in Room 9 and the patient's care was started 11:07 AM.   Patient ID: Brianna LinAshley C Garrett, female    DOB: 1985/09/15, 29 y.o.   MRN: [id number] Chief Complaint  Patient presents with  . Follow-up    pnuemonia, Dr. Clelia CroftShaw   HPI Brianna Garrett is a 29 y.o. female who [facility].  Saw her 2 days ago with left upper lobe expiratory rhonchi which cleared after a duo-neb. She had fever of 101, mildly tachycardic, appeared ill. Also with flank pain and small amount of hematuria on dip, but none on microscopy. Negative flu. Unfortunately pt didn't have insurance so held off on additional bloodwork and imaging. Started on treatment for pneumonia with augmentin bid. And asthma exacerbation with prednisone. Here for recheck.    She's doing well with the medications. She denies fever. She's still coughing and keeping her up at night. She denies taking any OTC medications at the time.   Past Medical History  Diagnosis Date  . Asthma   . Allergy    Prior to Admission medications   Medication Sig Start Date End Date Taking? Authorizing Provider  Albuterol Sulfate (PROAIR RESPICLICK) 108 (90 BASE) MCG/ACT AEPB Inhale 2 puffs into the lungs every 4 (four) hours as needed. 08/26/15   Sherren MochaEva N Rita Vialpando, MD  amoxicillin (AMOXIL) 500 MG capsule Take 1 capsule (500 mg total) by mouth 3 (three) times daily. Patient not taking: Reported on 08/26/2015 11/01/14   Shade FloodJeffrey R Greene, MD  amoxicillin-clavulanate (AUGMENTIN) 875-125 MG tablet Take 1 tablet by mouth 2 (two) times daily. 08/26/15   Sherren MochaEva N Ara Grandmaison, MD  Aspirin-Salicylamide-Caffeine (BC HEADACHE POWDER PO) Take 1 packet by mouth daily as needed (for pain). Reported on 08/26/2015    Historical Provider, MD  guaiFENesin (MUCINEX) 600 MG 12 hr tablet Take by mouth 2 (two) times daily. Reported on 08/26/2015    Historical  Provider, MD  ibuprofen (ADVIL,MOTRIN) 200 MG tablet Take 400 mg by mouth daily as needed for mild pain.    Historical Provider, MD  Menthol, Topical Analgesic, (ICY HOT) 16 % LIQD Apply 1 application topically daily as needed (for muscle pain). Reported on 08/26/2015    Historical Provider, MD  naproxen sodium (ANAPROX) 220 MG tablet Take 440 mg by mouth daily as needed (for pain). Reported on 08/26/2015    Historical Provider, MD  predniSONE (DELTASONE) 20 MG tablet Take 2 tablets (40 mg total) by mouth daily with breakfast. 08/26/15   Sherren MochaEva N Tiffnay Bossi, MD  promethazine-codeine [facility](PHENERGAN WITH CODEINE) 6.25-10 MG/5ML syrup Take 5-10 mLs by mouth every 6 (six) hours as needed for cough. 08/26/15   Sherren MochaEva N Logyn Kendrick, MD  Pseudoeph-CPM-DM-APAP (TYLENOL COLD PO) Take by mouth.    Historical Provider, MD   Allergies  Allergen Reactions  . Claritin [Loratadine]     "nose bleeds"  . Flonase [Fluticasone Propionate]     "nose bleeds"    Review of Systems  Constitutional: Positive for fatigue. Negative for fever and chills.  HENT: Positive for congestion.   Respiratory: Positive for cough. Negative for shortness of breath and wheezing.   Gastrointestinal: Negative for nausea, vomiting, diarrhea and constipation.  Neurological: Negative for dizziness and headaches.       Objective:   Physical Exam  Constitutional: She is oriented to person, place, and time. She appears well-developed and well-nourished. No  distress.  HENT:  Head: Normocephalic and atraumatic.  Right Ear: A middle ear effusion is present.  Left Ear: Tympanic membrane is erythematous and retracted.  Nose: Mucosal edema present.  Mouth/Throat: Posterior oropharyngeal edema present.  Eyes: EOM are normal. Pupils are equal, round, and reactive to light.  Neck: Neck supple.  Cardiovascular: Normal rate, regular rhythm, S1 normal, S2 normal and normal heart sounds.   No murmur heard. Pulmonary/Chest: Effort normal and breath sounds normal.  No respiratory distress. She has no wheezes.  Musculoskeletal: Normal range of motion.  Lymphadenopathy:    She has cervical adenopathy (anterior right > left).  Neurological: She is alert and oriented to person, place, and time.  Skin: Skin is warm and dry.  Psychiatric: She has a normal mood and affect. Her behavior is normal.  Nursing note and vitals reviewed.   BP 126/78 mmHg  Pulse 79  Temp(Src) 98 F (36.7 C) (Oral)  Resp 16  Ht  (1.753 m)  Wt 203 lb 12.8 oz (92.443 kg)  BMI 30.08 kg/m2  SpO2 99%  LMP 08/12/2015     Assessment & Plan:   1. Acute sinusitis, recurrence not specified, unspecified location - add in prednisone as sinus sxs worsening  2. CAP (community acquired pneumonia) - doing much better, complete course of aumentin, use delsym and mucinex while working    Meds ordered this encounter  Medications  . HYDROcodone-homatropine (HYCODAN) 5-1.5 MG/5ML syrup    Sig: Take 5 mLs by mouth every 8 (eight) hours as needed for cough.    Dispense:  120 mL    Refill:  0  . predniSONE (DELTASONE) 20 MG tablet    Sig: 3 tabs po qd x 2d, 2 tabs po qd x 2d, 1 tab po qd x 2d    Dispense:  12 tablet    Refill:  0    I personally performed the services described in this documentation, which was scribed in my presence. The recorded information has been reviewed and considered, and addended by me as needed.  Norberto Sorenson, MD MPH    By signing my name below, I, Stann Ore, attest that this documentation has been prepared under the direction and in the presence of Norberto Sorenson, MD. Electronically Signed: Stann Ore, Scribe. 08/28/2015 , 11:07 AM .

## 2015-08-28 NOTE — Patient Instructions (Signed)
Start taking the new prednisone course TOMORROW. Try the hycodan instead of the current cough syrup. Try delsym while you are work.  Community-Acquired Pneumonia, Adult Pneumonia is an infection of the lungs. There are different types of pneumonia. One type can develop while a person is in a hospital. A different type, called community-acquired pneumonia, develops in people who are not, or have not recently been, in the hospital or other health care facility.  CAUSES Pneumonia may be caused by bacteria, viruses, or funguses. Community-acquired pneumonia is often caused by Streptococcus pneumonia bacteria. These bacteria are often passed from one person to another by breathing in droplets from the cough or sneeze of an infected person. RISK FACTORS The condition is more likely to develop in:  People who havechronic diseases, such as chronic obstructive pulmonary disease (COPD), asthma, congestive heart failure, cystic fibrosis, diabetes, or kidney disease.  People who haveearly-stage or late-stage HIV.  People who havesickle cell disease.  People who havehad their spleen removed (splenectomy).  People who havepoor Administratordental hygiene.  People who havemedical conditions that increase the risk of breathing in (aspirating) secretions their own mouth and nose.   People who havea weakened immune system (immunocompromised).  People who smoke.  People whotravel to areas where pneumonia-causing germs commonly exist.  People whoare around animal habitats or animals that have pneumonia-causing germs, including birds, bats, rabbits, cats, and farm animals. SYMPTOMS Symptoms of this condition include:  Adry cough.  A wet (productive) cough.  Fever.  Sweating.  Chest pain, especially when breathing deeply or coughing.  Rapid breathing or difficulty breathing.  Shortness of breath.  Shaking chills.  Fatigue.  Muscle aches. DIAGNOSIS Your health care provider will take a  medical history and perform a physical exam. You may also have other tests, including:  Imaging studies of your chest, including X-rays.  Tests to check your blood oxygen level and other blood gases.  Other tests on blood, mucus (sputum), fluid around your lungs (pleural fluid), and urine. If your pneumonia is severe, other tests may be done to identify the specific cause of your illness. TREATMENT The type of treatment that you receive depends on many factors, such as the cause of your pneumonia, the medicines you take, and other medical conditions that you have. For most adults, treatment and recovery from pneumonia may occur at home. In some cases, treatment must happen in a hospital. Treatment may include:  Antibiotic medicines, if the pneumonia was caused by bacteria.  Antiviral medicines, if the pneumonia was caused by a virus.  Medicines that are given by mouth or through an IV tube.  Oxygen.  Respiratory therapy. Although rare, treating severe pneumonia may include:  Mechanical ventilation. This is done if you are not breathing well on your own and you cannot maintain a safe blood oxygen level.  Thoracentesis. This procedureremoves fluid around one lung or both lungs to help you breathe better. HOME CARE INSTRUCTIONS  Take over-the-counter and prescription medicines only as told by your health care provider.  Only takecough medicine if you are losing sleep. Understand that cough medicine can prevent your body's natural ability to remove mucus from your lungs.  If you were prescribed an antibiotic medicine, take it as told by your health care provider. Do not stop taking the antibiotic even if you start to feel better.  Sleep in a semi-upright position at night. Try sleeping in a reclining chair, or place a few pillows under your head.  Do not use tobacco products, including  cigarettes, chewing tobacco, and e-cigarettes. If you need help quitting, ask your health care  provider.  Drink enough water to keep your urine clear or pale yellow. This will help to thin out mucus secretions in your lungs. PREVENTION There are ways that you can decrease your risk of developing community-acquired pneumonia. Consider getting a pneumococcal vaccine if:  You are older than 30 years of age.  You are older than 30 years of age and are undergoing cancer treatment, have chronic lung disease, or have other medical conditions that affect your immune system. Ask your health care provider if this applies to you. There are different types and schedules of pneumococcal vaccines. Ask your health care provider which vaccination option is best for you. You may also prevent community-acquired pneumonia if you take these actions:  Get an influenza vaccine every year. Ask your health care provider which type of influenza vaccine is best for you.  Go to the dentist on a regular basis.  Wash your hands often. Use hand sanitizer if soap and water are not available. SEEK MEDICAL CARE IF:  You have a fever.  You are losing sleep because you cannot control your cough with cough medicine. SEEK IMMEDIATE MEDICAL CARE IF:  You have worsening shortness of breath.  You have increased chest pain.  Your sickness becomes worse, especially if you are an older adult or have a weakened immune system.  You cough up blood.   This information is not intended to replace advice given to you by your health care provider. Make sure you discuss any questions you have with your health care provider.   Document Released: 08/24/2005 Document Revised: 05/15/2015 Document Reviewed: 12/19/2014 Elsevier Interactive Patient Education [employer]

## 2015-10-09 ENCOUNTER — Ambulatory Visit (INDEPENDENT_AMBULATORY_CARE_PROVIDER_SITE_OTHER): Payer: BLUE CROSS/BLUE SHIELD

## 2015-10-09 ENCOUNTER — Ambulatory Visit (INDEPENDENT_AMBULATORY_CARE_PROVIDER_SITE_OTHER): Payer: BLUE CROSS/BLUE SHIELD | Admitting: Family Medicine

## 2015-10-09 VITALS — BP 124/80 | HR 77 | Temp 97.8°F | Resp 18 | Ht 69.29 in | Wt 200.6 lb

## 2015-10-09 DIAGNOSIS — R1012 Left upper quadrant pain: Secondary | ICD-10-CM | POA: Diagnosis not present

## 2015-10-09 DIAGNOSIS — B349 Viral infection, unspecified: Secondary | ICD-10-CM

## 2015-10-09 DIAGNOSIS — R109 Unspecified abdominal pain: Secondary | ICD-10-CM

## 2015-10-09 DIAGNOSIS — J45901 Unspecified asthma with (acute) exacerbation: Secondary | ICD-10-CM | POA: Diagnosis not present

## 2015-10-09 DIAGNOSIS — E860 Dehydration: Secondary | ICD-10-CM | POA: Diagnosis not present

## 2015-10-09 LAB — POCT CBC
GRANULOCYTE PERCENT: 57.7 % (ref 37–80)
HCT, POC: 40 % (ref 37.7–47.9)
HEMOGLOBIN: 13.7 g/dL (ref 12.2–16.2)
Lymph, poc: 1.8 (ref 0.6–3.4)
MCH: 31.5 pg — AB (ref 27–31.2)
MCHC: 34.2 g/dL (ref 31.8–35.4)
MCV: 92.1 fL (ref 80–97)
MID (CBC): 0.2 (ref 0–0.9)
MPV: 8.4 fL (ref 0–99.8)
PLATELET COUNT, POC: 231 10*3/uL (ref 142–424)
POC Granulocyte: 2.7 (ref 2–6.9)
POC LYMPH PERCENT: 37.4 %L (ref 10–50)
POC MID %: 4.9 %M (ref 0–12)
RBC: 4.34 M/uL (ref 4.04–5.48)
RDW, POC: 14.1 %
WBC: 4.7 10*3/uL (ref 4.6–10.2)

## 2015-10-09 LAB — POCT URINALYSIS DIP (MANUAL ENTRY)
Bilirubin, UA: NEGATIVE
Glucose, UA: NEGATIVE
Leukocytes, UA: NEGATIVE
NITRITE UA: NEGATIVE
PH UA: 5.5
PROTEIN UA: NEGATIVE
RBC UA: NEGATIVE
SPEC GRAV UA: 1.025
UROBILINOGEN UA: 1

## 2015-10-09 LAB — POC MICROSCOPIC URINALYSIS (UMFC): Mucus: ABSENT

## 2015-10-09 MED ORDER — ALBUTEROL SULFATE HFA 108 (90 BASE) MCG/ACT IN AERS: 2.0000 | INHALATION_SPRAY | RESPIRATORY_TRACT | Status: DC | PRN

## 2015-10-09 MED ORDER — HYDROCODONE-HOMATROPINE 5-1.5 MG/5ML PO SYRP: 5.0000 mL | ORAL_SOLUTION | Freq: Three times a day (TID) | ORAL | Status: DC | PRN

## 2015-10-09 MED ORDER — ALBUTEROL SULFATE (2.5 MG/3ML) 0.083% IN NEBU
2.5000 mg | INHALATION_SOLUTION | Freq: Once | RESPIRATORY_TRACT | Status: AC
  Administered 2015-10-09: 2.5 mg via RESPIRATORY_TRACT

## 2015-10-09 MED ORDER — PREDNISONE 20 MG PO TABS: ORAL_TABLET | ORAL | Status: DC

## 2015-10-09 NOTE — Patient Instructions (Addendum)
Because you received an x-ray today, you will receive an invoice from [facility]. Please contact [facility] at [phone] with questions or concerns regarding your invoice. Our billing staff will not be able to assist you with those questions.  Asthma, Acute Bronchospasm Acute bronchospasm caused by asthma is also referred to as an asthma attack. Bronchospasm means your air passages become narrowed. The narrowing is caused by inflammation and tightening of the muscles in the air tubes (bronchi) in your lungs. This can make it hard to breathe or cause you to wheeze and cough. CAUSES Possible triggers are:  Animal dander from the skin, hair, or feathers of animals.  Dust mites contained in house dust.  Cockroaches.  Pollen from trees or grass.  Mold.  Cigarette or tobacco smoke.  Air pollutants such as dust, household cleaners, hair sprays, aerosol sprays, paint fumes, strong chemicals, or strong odors.  Cold air or weather changes. Cold air may trigger inflammation. Winds increase molds and pollens in the air.  Strong emotions such as crying or laughing hard.  Stress.  Certain medicines such as aspirin or beta-blockers.  Sulfites in foods and drinks, such as dried fruits and wine.  Infections or inflammatory conditions, such as a flu, cold, or inflammation of the nasal membranes (rhinitis).  Gastroesophageal reflux disease (GERD). GERD is a condition where stomach acid backs up into your esophagus.  Exercise or strenuous activity. SIGNS AND SYMPTOMS   Wheezing.  Excessive coughing, particularly at night.  Chest tightness.  Shortness of breath. DIAGNOSIS  Your health care provider will ask you about your medical history and perform a physical exam. A chest X-ray or blood testing may be performed to look for other causes of your symptoms or other conditions that may have triggered your asthma attack. TREATMENT  Treatment is aimed at reducing  inflammation and opening up the airways in your lungs. Most asthma attacks are treated with inhaled medicines. These include quick relief or rescue medicines (such as bronchodilators) and controller medicines (such as inhaled corticosteroids). These medicines are sometimes given through an inhaler or a nebulizer. Systemic steroid medicine taken by mouth or given through an IV tube also can be used to reduce the inflammation when an attack is moderate or severe. Antibiotic medicines are only used if a bacterial infection is present.  HOME CARE INSTRUCTIONS   Rest.  Drink plenty of liquids. This helps the mucus to remain thin and be easily coughed up. Only use caffeine in moderation and do not use alcohol until you have recovered from your illness.  Do not smoke. Avoid being exposed to secondhand smoke.  You play a critical role in keeping yourself in good health. Avoid exposure to things that cause you to wheeze or to have breathing problems.  Keep your medicines up-to-date and available. Carefully follow your health care provider's treatment plan.  Take your medicine exactly as prescribed.  When pollen or pollution is bad, keep windows closed and use an air conditioner or go to places with air conditioning.  Asthma requires careful medical care. See your health care provider for a follow-up as advised. If you are more than [redacted] weeks pregnant and you were prescribed any new medicines, let your obstetrician know about the visit and how you are doing. Follow up with your health care provider as directed.  After you have recovered from your asthma attack, make an appointment with your outpatient doctor to talk about ways to reduce the likelihood of future attacks. If you do   not have a doctor who manages your asthma, make an appointment with a primary care doctor to discuss your asthma. SEEK IMMEDIATE MEDICAL CARE IF:   You are getting worse.  You have trouble breathing. If severe, call your local  emergency services (911 in the [place]  You develop chest pain or discomfort.  You are vomiting.  You are not able to keep fluids down.  You are coughing up yellow, green, brown, or bloody sputum.  You have a fever and your symptoms suddenly get worse.  You have trouble swallowing. MAKE SURE YOU:   Understand these instructions.  Will watch your condition.  Will get help right away if you are not doing well or get worse.   This information is not intended to replace advice given to you by your health care provider. Make sure you discuss any questions you have with your health care provider.   Document Released: 12/09/2006 Document Revised: 08/29/2013 Document Reviewed: 03/01/2013 Elsevier Interactive Patient Education 2016 Elsevier Inc.  

## 2015-10-09 NOTE — Progress Notes (Signed)
Subjective:  By signing my name below, I, Brianna Garrett, attest that this documentation has been prepared under the direction and in the presence of Brianna Sorenson, MD. Electronically Signed: Stann Garrett, Scribe. 10/09/2015 , 2:25 PM .  Patient was seen in Room 14 .   Patient ID: Brianna Garrett, female    DOB: 01-30-1986, 30 y.o.   MRN: [id number] Chief Complaint  Patient presents with  . Wheezing  . Back Pain  . Cough    yellow/ green phlegm  . Chills   HPI Brianna Garrett is a 30 y.o. female who presents to [facility] complaining of productive cough (yellowish green phlegm) with wheezing, back pain and chills that started a week ago. She was treated with a course of augmentin and prednisone 6 weeks prior.   She feels cold at night at her toes and fingertips and would take her about 2 hours to warm up. She also reports sinus pressure all around her head, blowing mucus out of her nose (sometimes yellow green, sometimes clear, but no blood), sore throat, wheeze and coughing fits at night. She's trid using her inhaler but without relief. She denies having a nebulizer machine at home. She denies urinary symptoms.   Past Medical History  Diagnosis Date  . Asthma   . Allergy    Prior to Admission medications   Medication Sig Start Date End Date Taking? Authorizing Provider  Albuterol Sulfate (PROAIR RESPICLICK) 108 (90 BASE) MCG/ACT AEPB Inhale 2 puffs into the lungs every 4 (four) hours as needed. 08/26/15  Yes Sherren Mocha, MD  amoxicillin-clavulanate (AUGMENTIN) 875-125 MG tablet Take 1 tablet by mouth 2 (two) times daily. 08/26/15  Yes Sherren Mocha, MD  Aspirin-Salicylamide-Caffeine [facility] HEADACHE POWDER PO) Take 1 packet by mouth daily as needed (for pain). Reported on 08/28/2015   Yes Historical Provider, MD  guaiFENesin (MUCINEX) 600 MG 12 hr tablet Take by mouth 2 (two) times daily. Reported on 08/28/2015   Yes Historical Provider, MD  HYDROcodone-homatropine (HYCODAN) 5-1.5 MG/5ML syrup  Take 5 mLs by mouth every 8 (eight) hours as needed for cough. 08/28/15  Yes Sherren Mocha, MD  ibuprofen (ADVIL,MOTRIN) 200 MG tablet Take 400 mg by mouth daily as needed for mild pain. Reported on 08/28/2015   Yes Historical Provider, MD  Menthol, Topical Analgesic, (ICY HOT) 16 % LIQD Apply 1 application topically daily as needed (for muscle pain). Reported on 08/26/2015   Yes Historical Provider, MD  naproxen sodium (ANAPROX) 220 MG tablet Take 440 mg by mouth daily as needed (for pain). Reported on 08/26/2015   Yes Historical Provider, MD  predniSONE (DELTASONE) 20 MG tablet 3 tabs po qd x 2d, 2 tabs po qd x 2d, 1 tab po qd x 2d 08/28/15  Yes Sherren Mocha, MD  Pseudoeph-CPM-DM-APAP (TYLENOL COLD PO) Take by mouth. Reported on 08/28/2015   Yes Historical Provider, MD   Allergies  Allergen Reactions  . Claritin [Loratadine]     "nose bleeds"  . Flonase [Fluticasone Propionate]     "nose bleeds"    Review of Systems  Constitutional: Positive for chills, appetite change and fatigue. Negative for fever and diaphoresis.  HENT: Positive for congestion, sinus pressure and sore throat.   Respiratory: Positive for cough.   Gastrointestinal: Negative for nausea, vomiting, abdominal pain, diarrhea and constipation.  Genitourinary: Negative for dysuria, frequency and hematuria.       Objective:   Physical Exam  Constitutional: She is oriented to person, place,  and time. She appears well-developed and well-nourished. No distress.  HENT:  Head: Normocephalic and atraumatic.  Right Ear: Tympanic membrane is retracted.  Left Ear: Tympanic membrane normal.  Nose: Mucosal edema (Right > left ) present.  Mouth/Throat: Posterior oropharyngeal erythema (mild) present.  Eyes: EOM are normal. Pupils are equal, round, and reactive to light.  Neck: Neck supple.  Cardiovascular: Normal rate, regular rhythm, S1 normal, S2 normal and normal heart sounds.   No murmur heard. Pulmonary/Chest: Effort normal. No  respiratory distress. She has decreased breath sounds.  Abdominal: Soft. Bowel sounds are normal. She exhibits no distension. There is no tenderness. There is CVA tenderness (left).  Musculoskeletal: Normal range of motion.  Lymphadenopathy:    She has cervical adenopathy (anterior).       Right: No supraclavicular adenopathy present.       Left: No supraclavicular adenopathy present.  Neurological: She is alert and oriented to person, place, and time.  Skin: Skin is warm and dry.  Psychiatric: She has a normal mood and affect. Her behavior is normal.  Nursing note and vitals reviewed.  BP 124/80 mmHg  Pulse 77  Temp(Src) 97.8 F (36.6 C) (Oral)  Resp 18  Ht 5' 9.29" (1.76 m)  Wt 200 lb 9.6 oz (90.992 kg)  BMI 29.38 kg/m2  SpO2 99%  LMP 09/23/2015   Results for orders placed or performed in visit on 10/09/15  POCT urinalysis dipstick  Result Value Ref Range   Color, UA yellow yellow   Clarity, UA clear clear   Glucose, UA negative negative   Bilirubin, UA negative negative   Ketones, POC UA trace (5) (A) negative   Spec Grav, UA 1.025    Blood, UA negative negative   pH, UA 5.5    Protein Ur, POC negative negative   Urobilinogen, UA 1.0    Nitrite, UA Negative Negative   Leukocytes, UA Negative Negative  POCT Microscopic Urinalysis ([facility])  Result Value Ref Range   WBC,UR,HPF,POC Few (A) None WBC/hpf   RBC,UR,HPF,POC None None RBC/hpf   Bacteria None None, Too numerous to count   Mucus Absent Absent   Epithelial Cells, UR Per Microscopy Moderate (A) None, Too numerous to count cells/hpf  POCT CBC  Result Value Ref Range   WBC 4.7 4.6 - 10.2 K/uL   Lymph, poc 1.8 0.6 - 3.4   POC LYMPH PERCENT 37.4 10 - 50 %L   MID (cbc) 0.2 0 - 0.9   POC MID % 4.9 0 - 12 %M   POC Granulocyte 2.7 2 - 6.9   Granulocyte percent 57.7 37 - 80 %G   RBC 4.34 4.04 - 5.48 M/uL   Hemoglobin 13.7 12.2 - 16.2 g/dL   HCT, POC 08.6 57.8 - 47.9 %   MCV 92.1 80 - 97 fL   MCH, POC 31.5 (A) 27 -  31.2 pg   MCHC 34.2 31.8 - 35.4 g/dL   RDW, POC 46.9 %   Platelet Count, POC 231 142 - 424 K/uL   MPV 8.4 0 - 99.8 fL   Dg Chest 2 View  10/09/2015  CLINICAL DATA:  Productive cough and wheezing and back pain and chills; onset of symptoms 1 week ago; former smoker; history of asthma. EXAM: CHEST  2 VIEW COMPARISON:  None in PACs FINDINGS: The lungs are mildly hyperinflated and clear. The heart and pulmonary vascularity are normal. The mediastinum is normal in width. There is no pleural effusion. The bony thorax exhibits no acute abnormality.  IMPRESSION: Mild hyperinflation consistent with reactive airway disease. There is no pneumonia nor other acute cardiopulmonary abnormality. Electronically Signed   By: David  [place] M.D.   On: 10/09/2015 14:40      Assessment & Plan:   1. Asthma with acute exacerbation, unspecified asthma severity   2. Acute left flank pain   3. Dehydration   4. Acute viral syndrome     Orders Placed This Encounter  Procedures  . DG Chest 2 View    Standing Status: Future     Number of Occurrences: 1     Standing Expiration Date: 10/08/2016    Order Specific Question:  Reason for Exam (SYMPTOM  OR DIAGNOSIS REQUIRED)    Answer:  recurrent shortness of breath, cough    Order Specific Question:  Is the patient pregnant?    Answer:  No    Order Specific Question:  Preferred imaging location?    Answer:  External  . POCT urinalysis dipstick  . POCT Microscopic Urinalysis ([facility])  . POCT CBC    Meds ordered this encounter  Medications  . albuterol (PROVENTIL) (2.5 MG/3ML) 0.083% nebulizer solution 2.5 mg    Sig:   . HYDROcodone-homatropine (HYCODAN) 5-1.5 MG/5ML syrup    Sig: Take 5 mLs by mouth every 8 (eight) hours as needed for cough.    Dispense:  120 mL    Refill:  0  . predniSONE (DELTASONE) 20 MG tablet    Sig: 3 tabs po qd x 2d, 2 tabs po qd x 2d, 1 tab po qd x 2d    Dispense:  12 tablet    Refill:  0  . albuterol (PROVENTIL HFA;VENTOLIN HFA) 108 (90  Base) MCG/ACT inhaler    Sig: Inhale 2 puffs into the lungs every 4 (four) hours as needed for wheezing or shortness of breath (cough, shortness of breath or wheezing.).    Dispense:  1 Inhaler    Refill:  11    I personally performed the services described in this documentation, which was scribed in my presence. The recorded information has been reviewed and considered, and addended by me as needed.  Brianna Sorenson, MD MPH

## 2016-04-01 ENCOUNTER — Encounter (HOSPITAL_COMMUNITY): Payer: Self-pay | Admitting: Emergency Medicine

## 2016-04-01 ENCOUNTER — Emergency Department (HOSPITAL_COMMUNITY)
Admission: EM | Admit: 2016-04-01 | Discharge: 2016-04-02 | Disposition: A | Discharge status: Home / Self Care | Payer: Self-pay | Attending: Emergency Medicine | Admitting: Emergency Medicine

## 2016-04-01 DIAGNOSIS — Z79899 Other long term (current) drug therapy: Secondary | ICD-10-CM | POA: Insufficient documentation

## 2016-04-01 DIAGNOSIS — J45901 Unspecified asthma with (acute) exacerbation: Secondary | ICD-10-CM | POA: Insufficient documentation

## 2016-04-01 DIAGNOSIS — F17200 Nicotine dependence, unspecified, uncomplicated: Secondary | ICD-10-CM | POA: Insufficient documentation

## 2016-04-01 DIAGNOSIS — J40 Bronchitis, not specified as acute or chronic: Secondary | ICD-10-CM

## 2016-04-01 MED ORDER — ALBUTEROL SULFATE (2.5 MG/3ML) 0.083% IN NEBU
INHALATION_SOLUTION | RESPIRATORY_TRACT | Status: AC
  Filled 2016-04-01: qty 6

## 2016-04-01 MED ORDER — ALBUTEROL SULFATE (2.5 MG/3ML) 0.083% IN NEBU
5.0000 mg | INHALATION_SOLUTION | Freq: Once | RESPIRATORY_TRACT | Status: AC
  Administered 2016-04-01: 5 mg via RESPIRATORY_TRACT

## 2016-04-01 NOTE — ED Triage Notes (Signed)
Patient reports asthma attack this evening with occasional productive cough / chest congestion unrelieved by metered dose inhaler .

## 2016-04-02 ENCOUNTER — Emergency Department (HOSPITAL_COMMUNITY): Payer: Self-pay

## 2016-04-02 MED ORDER — ALBUTEROL (5 MG/ML) CONTINUOUS INHALATION SOLN
10.0000 mg/h | INHALATION_SOLUTION | Freq: Once | RESPIRATORY_TRACT | Status: AC
  Administered 2016-04-02: 10 mg/h via RESPIRATORY_TRACT
  Filled 2016-04-02: qty 20

## 2016-04-02 MED ORDER — PREDNISONE 10 MG PO TABS: ORAL_TABLET | ORAL | 0 refills | Status: DC

## 2016-04-02 MED ORDER — METHYLPREDNISOLONE SODIUM SUCC 125 MG IJ SOLR
125.0000 mg | Freq: Once | INTRAMUSCULAR | Status: AC
  Administered 2016-04-02: 125 mg via INTRAVENOUS
  Filled 2016-04-02: qty 2

## 2016-04-02 MED ORDER — AEROCHAMBER PLUS W/MASK MISC
Status: AC
  Filled 2016-04-02: qty 1

## 2016-04-02 MED ORDER — AZITHROMYCIN 250 MG PO TABS: 250.0000 mg | ORAL_TABLET | Freq: Every day | ORAL | 0 refills | Status: DC

## 2016-04-02 MED ORDER — ALBUTEROL SULFATE HFA 108 (90 BASE) MCG/ACT IN AERS
2.0000 | INHALATION_SPRAY | RESPIRATORY_TRACT | Status: DC
  Administered 2016-04-02: 2 via RESPIRATORY_TRACT
  Filled 2016-04-02: qty 6.7

## 2016-04-02 NOTE — ED Notes (Signed)
Patient transported to X-ray 

## 2016-04-02 NOTE — ED Provider Notes (Signed)
[facility] Provider Note   CSN: [id number] Arrival date & time: 04/01/16  2219  First Provider Contact:  None       History   Chief Complaint Chief Complaint  Patient presents with  . Asthma    HPI Brianna Garrett is a 30 y.o. female.  Pt reports she has been having a lot of difficulty with asthma attacks recently.  Pt has been using proair inhaler without relief.   Pt reports fever today.  Pt ate work [employer] and she could not get relief with inhaler. Pt reports she has sinus congestion and a cough.  Pt complains of congestion in her chest   The history is provided by the patient. No language interpreter was used.  Asthma  This is a new problem. The current episode started yesterday. The problem has been gradually worsening. Associated symptoms include a fever. Pertinent negatives include no abdominal pain, chills or headaches. Nothing aggravates the symptoms. She has tried nothing for the symptoms. The treatment provided moderate relief.    Past Medical History:  Diagnosis Date  . Allergy   . Asthma     There are no active problems to display for this patient.   Past Surgical History:  Procedure Laterality Date  . HIP SURGERY      OB History    No data available       Home Medications    Prior to Admission medications   Medication Sig Start Date End Date Taking? Authorizing Provider  albuterol (PROVENTIL HFA;VENTOLIN HFA) 108 (90 Base) MCG/ACT inhaler Inhale 2 puffs into the lungs every 4 (four) hours as needed for wheezing or shortness of breath (cough, shortness of breath or wheezing.). 10/09/15  Yes Sherren Mocha, MD  ALPRAZolam Prudy Feeler) 1 MG tablet Take 0.5 mg by mouth at bedtime as needed for anxiety.   Yes Historical Provider, MD  ibuprofen (ADVIL,MOTRIN) 200 MG tablet Take 400 mg by mouth daily as needed for mild pain. Reported on 08/28/2015   Yes Historical Provider, MD  HYDROcodone-homatropine (HYCODAN) 5-1.5 MG/5ML syrup Take 5 mLs by mouth  every 8 (eight) hours as needed for cough. Patient not taking: Reported on 04/02/2016 10/09/15   Sherren Mocha, MD  predniSONE (DELTASONE) 20 MG tablet 3 tabs po qd x 2d, 2 tabs po qd x 2d, 1 tab po qd x 2d Patient not taking: Reported on 04/02/2016 10/09/15   Sherren Mocha, MD    Family History No family history on file.  Social History Social History  Substance Use Topics  . Smoking status: Current Every Day Smoker  . Smokeless tobacco: Not on file  . Alcohol use 0.0 oz/week     Allergies   Claritin [loratadine] and Flonase [fluticasone propionate]   Review of Systems Review of Systems  Constitutional: Positive for fever. Negative for chills.  Gastrointestinal: Negative for abdominal pain.  Neurological: Negative for headaches.  All other systems reviewed and are negative.    Physical Exam Updated Vital Signs BP 120/74   Pulse 104   Temp 98.6 F (37 C) (Oral)   Resp 16   Ht 5\' 11"  (1.803 m)   Wt 88.5 kg   LMP 03/23/2016   SpO2 100%   BMI 27.20 kg/m   Physical Exam  Constitutional: She is oriented to person, place, and time. She appears well-developed and well-nourished.  HENT:  Head: Normocephalic.  Eyes: Conjunctivae and EOM are normal. Pupils are equal, round, and reactive to light.  Neck: Normal range  of motion.  Cardiovascular: Normal rate, regular rhythm and normal heart sounds.   Pulmonary/Chest: She has wheezes.  Abdominal: Soft. Bowel sounds are normal. She exhibits no distension.  Musculoskeletal: Normal range of motion.  Neurological: She is alert and oriented to person, place, and time.  Skin: Skin is warm. Capillary refill takes less than 2 seconds.  Psychiatric: She has a normal mood and affect. Her behavior is normal.  Nursing note and vitals reviewed.    ED Treatments / Results  Labs (all labs ordered are listed, but only abnormal results are displayed) Labs Reviewed - No data to display  EKG  EKG Interpretation None       Radiology Dg  Chest 2 View  Result Date: 04/02/2016 CLINICAL DATA:  30 year old female go with cough and congestion EXAM: CHEST  2 VIEW COMPARISON:  Chest radiograph dated 11/13/2013 FINDINGS: The heart size and mediastinal contours are within normal limits. Both lungs are clear. The visualized skeletal structures are unremarkable. IMPRESSION: No active cardiopulmonary disease. Electronically Signed   By: Elgie Collard M.D.   On: 04/02/2016 01:00   Procedures Procedures (including critical care time)  Medications Ordered in ED Medications  albuterol (PROVENTIL) (2.5 MG/3ML) 0.083% nebulizer solution (  Canceled Entry 04/02/16 0013)  albuterol (PROVENTIL) (2.5 MG/3ML) 0.083% nebulizer solution 5 mg (5 mg Nebulization Given 04/01/16 2238)  methylPREDNISolone sodium succinate (SOLU-MEDROL) 125 mg/2 mL injection 125 mg (125 mg Intravenous Given 04/02/16 0058)  albuterol (PROVENTIL,VENTOLIN) solution continuous neb (10 mg/hr Nebulization Given 04/02/16 0122)     Initial Impression / Assessment and Plan / ED Course  I have reviewed the triage vital signs and the nursing notes.  Pertinent labs & imaging results that were available during my care of the patient were reviewed by me and considered in my medical decision making (see chart for details).  Clinical Course   Pt given duoneb before my evaluation.  Pt has wheezing all lobes,  Chest xray no pneumonia.  Pt reports oral steroids have caused her problems in the past.  She can tolerate IV.  Pt given continous nebulization here. Solumedrol Iv.  Pt given albuterol inhaler here.  Rx for zithromax Pt advised to see her MD for recheck.   Final Clinical Impressions(s) / ED Diagnoses   Final diagnoses:  Asthma exacerbation  Bronchitis    New Prescriptions New Prescriptions   AZITHROMYCIN (ZITHROMAX) 250 MG TABLET    Take 1 tablet (250 mg total) by mouth daily. Take first 2 tablets together, then 1 every day until finished.    An After Visit Summary was  printed and given to the patient. Elson Areas, PA-C 04/02/16 0255    Elson Areas, PA-C 04/02/16 1610    Shon Baton, MD 04/06/16 [phone]

## 2016-04-02 NOTE — ED Notes (Signed)
Pt understood dc material. NAD noted. Scripts given at dc 

## 2016-04-04 ENCOUNTER — Emergency Department (HOSPITAL_COMMUNITY)
Admission: EM | Admit: 2016-04-04 | Discharge: 2016-04-04 | Disposition: A | Discharge status: Home / Self Care | Payer: Self-pay | Attending: Emergency Medicine | Admitting: Emergency Medicine

## 2016-04-04 ENCOUNTER — Encounter (HOSPITAL_COMMUNITY): Payer: Self-pay

## 2016-04-04 DIAGNOSIS — J45901 Unspecified asthma with (acute) exacerbation: Secondary | ICD-10-CM | POA: Insufficient documentation

## 2016-04-04 DIAGNOSIS — R05 Cough: Secondary | ICD-10-CM

## 2016-04-04 DIAGNOSIS — F17200 Nicotine dependence, unspecified, uncomplicated: Secondary | ICD-10-CM | POA: Insufficient documentation

## 2016-04-04 DIAGNOSIS — R059 Cough, unspecified: Secondary | ICD-10-CM

## 2016-04-04 MED ORDER — BENZONATATE 100 MG PO CAPS: 100.0000 mg | ORAL_CAPSULE | Freq: Three times a day (TID) | ORAL | 0 refills | Status: DC | PRN

## 2016-04-04 NOTE — ED Provider Notes (Signed)
[facility] Provider Note   CSN: [id number] Arrival date & time: 04/04/16  1120  First Provider Contact:  First MD Initiated Contact with Patient 04/04/16 1517        History   Chief Complaint Chief Complaint  Patient presents with  . Asthma    HPI Brianna Garrett is a 30 y.o. female presenting with cough, chest pain, headache, and shortness of breath. Has been having these symptoms since being seen 2 days ago in the ER. At that time she was diagnosed with an asthma exacerbation and given breathing treatments, IV Solu-Medrol, and discharged with azithromycin. She is currently taking the antibiotics. Patient states she does not do well with oral steroids, it causes her to feel weak and fatigued as well as have back pain. However she does not get rash, throat closing, shortness of breath, or other allergic symptoms. She is continuing to cough and feels chest pain both with coughing and at rest. Has been using her albuterol inhaler but does not feel it's helping. Had a temp of 101 last night  HPI  Past Medical History:  Diagnosis Date  . Allergy   . Asthma     There are no active problems to display for this patient.   Past Surgical History:  Procedure Laterality Date  . HIP SURGERY      OB History    No data available       Home Medications    Prior to Admission medications   Medication Sig Start Date End Date Taking? Authorizing Provider  albuterol (PROVENTIL HFA;VENTOLIN HFA) 108 (90 Base) MCG/ACT inhaler Inhale 2 puffs into the lungs every 4 (four) hours as needed for wheezing or shortness of breath (cough, shortness of breath or wheezing.). 10/09/15   Sherren Mocha, MD  ALPRAZolam Prudy Feeler) 1 MG tablet Take 0.5 mg by mouth at bedtime as needed for anxiety.    Historical Provider, MD  azithromycin (ZITHROMAX) 250 MG tablet Take 1 tablet (250 mg total) by mouth daily. Take first 2 tablets together, then 1 every day until finished. 04/02/16   Elson Areas, PA-C    benzonatate (TESSALON) 100 MG capsule Take 1 capsule (100 mg total) by mouth 3 (three) times daily as needed for cough. 04/04/16   Pricilla Loveless, MD  predniSONE (DELTASONE) 10 MG tablet 6,5,4,3,2,1 taper 04/02/16   Elson Areas, PA-C    Family History No family history on file.  Social History Social History  Substance Use Topics  . Smoking status: Current Every Day Smoker  . Smokeless tobacco: Never Used  . Alcohol use 0.0 oz/week     Allergies   Claritin [loratadine] and Flonase [fluticasone propionate]   Review of Systems Review of Systems  Constitutional: Positive for fever.  HENT: Positive for congestion.   Respiratory: Positive for cough and shortness of breath.   Cardiovascular: Positive for chest pain.  Neurological: Positive for headaches.  All other systems reviewed and are negative.    Physical Exam Updated Vital Signs BP (!) 133/53 (BP Location: Right Arm)   Pulse 86   Temp 97.8 F (36.6 C) (Oral)   Resp 18   Ht  (1.803 m)   Wt 195 lb (88.5 kg)   LMP 03/23/2016   SpO2 100%   BMI 27.20 kg/m   Physical Exam  Constitutional: She is oriented to person, place, and time. She appears well-developed and well-nourished. No distress.  HENT:  Head: Normocephalic and atraumatic.  Right Ear: External ear normal.  Left Ear: External ear normal.  Nose: Nose normal.  Eyes: Right eye exhibits no discharge. Left eye exhibits no discharge.  Cardiovascular: Normal rate, regular rhythm and normal heart sounds.   Pulmonary/Chest: Effort normal and breath sounds normal. She has no wheezes.  Speaks in full, complete sentences. No increased WOB  Abdominal: She exhibits no distension.  Neurological: She is alert and oriented to person, place, and time.  Skin: Skin is warm and dry. She is not diaphoretic.  Nursing note and vitals reviewed.    ED Treatments / Results  Labs (all labs ordered are listed, but only abnormal results are displayed) Labs Reviewed -  No data to display  EKG  EKG Interpretation None       Radiology No results found.  Procedures Procedures (including critical care time)  Medications Ordered in ED Medications - No data to display   Initial Impression / Assessment and Plan / ED Course  I have reviewed the triage vital signs and the nursing notes.  Pertinent labs & imaging results that were available during my care of the patient were reviewed by me and considered in my medical decision making (see chart for details).  Clinical Course    Patient is overall well appearing, O2 sats 100%, no increased work of breathing. No tachycardia. Her lungs are clear and my suspicion for pneumonia is low. However given her fever last night I offered repeat chest x-ray. She declines. Discussed the importance of steroids more than one dose of IV Solu-Medrol and after discussion, patient once to fill her prescription for steroids and try them at home. I have also offered her a cough suppressant which I will prescribe in the ER. I do not feel she needs acute airway management or breathing treatments currently, and have recommended that she follow-up with a pulmonologist which I will refer her to. Discussed return precautions.  Final Clinical Impressions(s) / ED Diagnoses   Final diagnoses:  Cough  Asthma exacerbation    New Prescriptions New Prescriptions   BENZONATATE (TESSALON) 100 MG CAPSULE    Take 1 capsule (100 mg total) by mouth 3 (three) times daily as needed for cough.     Pricilla Loveless, MD 04/04/16 1544

## 2016-04-04 NOTE — ED Notes (Signed)
MD at bedside. 

## 2016-04-04 NOTE — ED Triage Notes (Signed)
Patient here requesting further evaluation of cough and congestion x 2 days. Seen in ED this week and prescribed antibiotics and inhaler and steroids. Decided not to fill steroids because she doesn't like the way they make her feel. In no acute distress, used inhaler pta

## 2016-04-16 ENCOUNTER — Ambulatory Visit: Payer: Self-pay | Attending: Internal Medicine

## 2016-06-14 ENCOUNTER — Emergency Department (HOSPITAL_COMMUNITY)
Admission: EM | Admit: 2016-06-14 | Discharge: 2016-06-15 | Disposition: A | Discharge status: Home / Self Care | Payer: Self-pay | Attending: Emergency Medicine | Admitting: Emergency Medicine

## 2016-06-14 ENCOUNTER — Encounter (HOSPITAL_COMMUNITY): Payer: Self-pay | Admitting: Emergency Medicine

## 2016-06-14 DIAGNOSIS — N83202 Unspecified ovarian cyst, left side: Secondary | ICD-10-CM | POA: Insufficient documentation

## 2016-06-14 DIAGNOSIS — F17200 Nicotine dependence, unspecified, uncomplicated: Secondary | ICD-10-CM | POA: Insufficient documentation

## 2016-06-14 DIAGNOSIS — B9689 Other specified bacterial agents as the cause of diseases classified elsewhere: Secondary | ICD-10-CM | POA: Insufficient documentation

## 2016-06-14 DIAGNOSIS — N760 Acute vaginitis: Secondary | ICD-10-CM | POA: Insufficient documentation

## 2016-06-14 DIAGNOSIS — J45909 Unspecified asthma, uncomplicated: Secondary | ICD-10-CM | POA: Insufficient documentation

## 2016-06-14 LAB — URINALYSIS, ROUTINE W REFLEX MICROSCOPIC
BILIRUBIN URINE: NEGATIVE
GLUCOSE, UA: NEGATIVE mg/dL
Hgb urine dipstick: NEGATIVE
KETONES UR: NEGATIVE mg/dL
NITRITE: NEGATIVE
PROTEIN: NEGATIVE mg/dL
Specific Gravity, Urine: 1.015 (ref 1.005–1.030)
pH: 7 (ref 5.0–8.0)

## 2016-06-14 LAB — POC URINE PREG, ED: Preg Test, Ur: NEGATIVE

## 2016-06-14 LAB — BASIC METABOLIC PANEL
Anion gap: 9 (ref 5–15)
BUN: 13 mg/dL (ref 6–20)
CALCIUM: 9.2 mg/dL (ref 8.9–10.3)
CO2: 24 mmol/L (ref 22–32)
CREATININE: 0.85 mg/dL (ref 0.44–1.00)
Chloride: 105 mmol/L (ref 101–111)
GFR calc non Af Amer: >60 mL/min (ref >60)
Glucose, Bld: 108 mg/dL — ABNORMAL HIGH (ref 65–99)
Potassium: 3.6 mmol/L (ref 3.5–5.1)
Sodium: 138 mmol/L (ref 135–145)

## 2016-06-14 LAB — URINE MICROSCOPIC-ADD ON

## 2016-06-14 LAB — CBC
HCT: 39.9 % (ref 36.0–46.0)
Hemoglobin: 13.5 g/dL (ref 12.0–15.0)
MCH: 32.1 pg (ref 26.0–34.0)
MCHC: 33.8 g/dL (ref 30.0–36.0)
MCV: 95 fL (ref 78.0–100.0)
PLATELETS: 222 10*3/uL (ref 150–400)
RBC: 4.2 MIL/uL (ref 3.87–5.11)
RDW: 14.3 % (ref 11.5–15.5)
WBC: 6 10*3/uL (ref 4.0–10.5)

## 2016-06-14 NOTE — ED Provider Notes (Signed)
[facility] Provider Note   CSN: [id number] Arrival date & time: 06/14/16  2130     History   Chief Complaint Chief Complaint  Patient presents with  . Flank Pain  . Hematuria    HPI Brianna Garrett is a 30 y.o. female.  HPI   Patient has a PMH of allergies and asthma.  She comes to the ER for pelvic pain, left flank pain, and hematuria which she reports having since January. She comes in tonight because the pelvic pain is getting worse, no fevers, N/V/D, headaches, weakness, confusion, CP, SOB.  Patient says she has never had a pelvic exam. The pain is not necessarily more severe tonight but she states that it is more constant and is not going away like she has expected, normal VS, no acute distress.  Past Medical History:  Diagnosis Date  . Allergy   . Asthma     There are no active problems to display for this patient.   Past Surgical History:  Procedure Laterality Date  . HIP SURGERY      OB History    No data available       Home Medications    Prior to Admission medications   Medication Sig Start Date End Date Taking? Authorizing Provider  albuterol (PROVENTIL HFA;VENTOLIN HFA) 108 (90 Base) MCG/ACT inhaler Inhale 2 puffs into the lungs every 4 (four) hours as needed for wheezing or shortness of breath (cough, shortness of breath or wheezing.). Patient not taking: Reported on 06/15/2016 10/09/15   Sherren Mocha, MD  azithromycin (ZITHROMAX) 250 MG tablet Take 1 tablet (250 mg total) by mouth daily. Take first 2 tablets together, then 1 every day until finished. Patient not taking: Reported on 06/15/2016 04/02/16   Elson Areas, PA-C  benzonatate (TESSALON) 100 MG capsule Take 1 capsule (100 mg total) by mouth 3 (three) times daily as needed for cough. Patient not taking: Reported on 06/15/2016 04/04/16   Pricilla Loveless, MD  metroNIDAZOLE (FLAGYL) 500 MG tablet Take 1 tablet (500 mg total) by mouth 2 (two) times daily. 06/15/16   Robbie Nangle Neva Seat, PA-C    predniSONE (DELTASONE) 10 MG tablet 6,5,4,3,2,1 taper Patient not taking: Reported on 06/15/2016 04/02/16   Elson Areas, PA-C  traMADol (ULTRAM) 50 MG tablet Take 1 tablet (50 mg total) by mouth every 6 (six) hours as needed. 06/15/16   Marlon Pel, PA-C    Family History No family history on file.  Social History Social History  Substance Use Topics  . Smoking status: Current Every Day Smoker  . Smokeless tobacco: Never Used  . Alcohol use 0.0 oz/week     Allergies   Claritin [loratadine]; Flonase [fluticasone propionate]; and Pineapple   Review of Systems Review of Systems Review of Systems All other systems negative except as documented in the HPI. All pertinent positives and negatives as reviewed in the HPI.   Physical Exam Updated Vital Signs BP 126/72   Pulse 69   Temp 98 F (36.7 C) (Oral)   Resp 19   Ht 5\' 11"  (1.803 m)   Wt 90.6 kg   LMP 05/24/2016   SpO2 100%   BMI 27.85 kg/m   Physical Exam  Constitutional: She appears well-developed and well-nourished. No distress.  HENT:  Head: Normocephalic and atraumatic.  Eyes: Pupils are equal, round, and reactive to light.  Neck: Normal range of motion. Neck supple.  Cardiovascular: Normal rate and regular rhythm.   Pulmonary/Chest: Effort normal.  Abdominal: Soft.  There is tenderness in the suprapubic area. There is no rigidity, no rebound, no guarding, no CVA tenderness and negative Murphy's sign.  Genitourinary: Uterus normal. There is no tenderness on the right labia. There is no tenderness on the left labia. Cervix exhibits no motion tenderness, no discharge and no friability. Right adnexum displays no mass, no tenderness and no fullness. Left adnexum displays tenderness. Left adnexum displays no mass and no fullness. No bleeding in the vagina. No foreign body in the vagina. No vaginal discharge found.  Neurological: She is alert.  Skin: Skin is warm and dry.  Nursing note and vitals reviewed.    ED  Treatments / Results  Labs (all labs ordered are listed, but only abnormal results are displayed) Labs Reviewed  WET PREP, GENITAL - Abnormal; Notable for the following:       Result Value   Clue Cells Wet Prep HPF POC PRESENT (*)    WBC, Wet Prep HPF POC MANY (*)    All other components within normal limits  URINALYSIS, ROUTINE W REFLEX MICROSCOPIC (NOT [facility]) - Abnormal; Notable for the following:    Leukocytes, UA TRACE (*)    All other components within normal limits  BASIC METABOLIC PANEL - Abnormal; Notable for the following:    Glucose, Bld 108 (*)    All other components within normal limits  URINE MICROSCOPIC-ADD ON - Abnormal; Notable for the following:    Squamous Epithelial / LPF 0-5 (*)    Bacteria, UA RARE (*)    All other components within normal limits  CBC  POC URINE PREG, ED  GC/CHLAMYDIA PROBE AMP ([place]) NOT AT [facility]    EKG  EKG Interpretation None       Radiology [place] Transvaginal Non-ob  Result Date: 06/15/2016 CLINICAL DATA:  Acute onset of left-sided pelvic pain. Initial encounter. EXAM: TRANSABDOMINAL AND TRANSVAGINAL ULTRASOUND OF PELVIS TECHNIQUE: Both transabdominal and transvaginal ultrasound examinations of the pelvis were performed. Transabdominal technique was performed for global imaging of the pelvis including uterus, ovaries, adnexal regions, and pelvic cul-de-sac. It was necessary to proceed with endovaginal exam following the transabdominal exam to visualize the ovaries in greater detail. COMPARISON:  None FINDINGS: Uterus Measurements: 7.4 x 3.5 x 4.2 cm. No fibroids or other mass visualized. Endometrium Thickness: 1.0 cm.  No focal abnormality visualized. Right ovary Measurements: 2.9 x 1.7 x 2.5 cm. Normal appearance/no adnexal mass. Left ovary Measurements: 3.2 x 2.3 x 2.3 cm. A mildly complex 1.4 cm cystic focus at the left ovary could reflect a small hemorrhagic cyst. Limited Doppler evaluation demonstrates normal color Doppler blood  flow with respect to both ovaries. There is no evidence for ovarian torsion. Other findings Trace free fluid is noted at the pelvic cul-de-sac. IMPRESSION: 1. Uterus unremarkable in appearance. 2. No evidence for ovarian torsion. 3. Mildly complex 1.4 cm cystic focus at the left ovary could reflect a small hemorrhagic cyst. Electronically Signed   By: Roanna RaiderJeffery  Chang M.D.   On: 06/15/2016 01:42   [place] Pelvis Complete  Result Date: 06/15/2016 CLINICAL DATA:  Acute onset of left-sided pelvic pain. Initial encounter. EXAM: TRANSABDOMINAL AND TRANSVAGINAL ULTRASOUND OF PELVIS TECHNIQUE: Both transabdominal and transvaginal ultrasound examinations of the pelvis were performed. Transabdominal technique was performed for global imaging of the pelvis including uterus, ovaries, adnexal regions, and pelvic cul-de-sac. It was necessary to proceed with endovaginal exam following the transabdominal exam to visualize the ovaries in greater detail. COMPARISON:  None FINDINGS: Uterus Measurements: 7.4 x  3.5 x 4.2 cm. No fibroids or other mass visualized. Endometrium Thickness: 1.0 cm.  No focal abnormality visualized. Right ovary Measurements: 2.9 x 1.7 x 2.5 cm. Normal appearance/no adnexal mass. Left ovary Measurements: 3.2 x 2.3 x 2.3 cm. A mildly complex 1.4 cm cystic focus at the left ovary could reflect a small hemorrhagic cyst. Limited Doppler evaluation demonstrates normal color Doppler blood flow with respect to both ovaries. There is no evidence for ovarian torsion. Other findings Trace free fluid is noted at the pelvic cul-de-sac. IMPRESSION: 1. Uterus unremarkable in appearance. 2. No evidence for ovarian torsion. 3. Mildly complex 1.4 cm cystic focus at the left ovary could reflect a small hemorrhagic cyst. Electronically Signed   By: Roanna Raider M.D.   On: 06/15/2016 01:42    Procedures Procedures (including critical care time)  Medications Ordered in ED Medications  traMADol (ULTRAM) tablet 50 mg (not  administered)     Initial Impression / Assessment and Plan / ED Course  I have reviewed the triage vital signs and the nursing notes.  Pertinent labs & imaging results that were available during my care of the patient were reviewed by me and considered in my medical decision making (see chart for details).  Clinical Course    GC pending Advised to practice safe sex and have all partners evaluated and treated at the local health department. Also advised to follow with the health Department in 1-2 weeks to confirm effectiveness of treatment and receive additional education/evaluation. Return precautions given.   Final Clinical Impressions(s) / ED Diagnoses   Final diagnoses:  Cyst of left ovary  Bacterial vaginosis    New Prescriptions New Prescriptions   METRONIDAZOLE (FLAGYL) 500 MG TABLET    Take 1 tablet (500 mg total) by mouth 2 (two) times daily.   TRAMADOL (ULTRAM) 50 MG TABLET    Take 1 tablet (50 mg total) by mouth every 6 (six) hours as needed.     Marlon Pel, PA-C 06/15/16 0256    Nira Conn, MD 06/15/16 [phone]

## 2016-06-14 NOTE — ED Triage Notes (Signed)
Pt states "im having lower pelvic pain, L flank pain, and bloody urine since January".

## 2016-06-15 ENCOUNTER — Inpatient Hospital Stay (HOSPITAL_COMMUNITY)
Admission: AD | Admit: 2016-06-15 | Discharge: 2016-06-15 | Disposition: A | Discharge status: Home / Self Care | Payer: Self-pay | Source: Ambulatory Visit | Attending: Obstetrics and Gynecology | Admitting: Obstetrics and Gynecology

## 2016-06-15 ENCOUNTER — Emergency Department (HOSPITAL_COMMUNITY): Payer: Self-pay

## 2016-06-15 DIAGNOSIS — N83202 Unspecified ovarian cyst, left side: Secondary | ICD-10-CM | POA: Insufficient documentation

## 2016-06-15 DIAGNOSIS — R1032 Left lower quadrant pain: Secondary | ICD-10-CM | POA: Insufficient documentation

## 2016-06-15 DIAGNOSIS — F17200 Nicotine dependence, unspecified, uncomplicated: Secondary | ICD-10-CM | POA: Insufficient documentation

## 2016-06-15 LAB — WET PREP, GENITAL
Sperm: NONE SEEN
TRICH WET PREP: NONE SEEN
Yeast Wet Prep HPF POC: NONE SEEN

## 2016-06-15 LAB — GC/CHLAMYDIA PROBE AMP (~~LOC~~) NOT AT ARMC
CHLAMYDIA, DNA PROBE: NEGATIVE
Neisseria Gonorrhea: NEGATIVE

## 2016-06-15 MED ORDER — TRAMADOL HCL 50 MG PO TABS: 50.0000 mg | ORAL_TABLET | Freq: Four times a day (QID) | ORAL | 0 refills | Status: DC | PRN

## 2016-06-15 MED ORDER — TRAMADOL HCL 50 MG PO TABS
50.0000 mg | ORAL_TABLET | Freq: Once | ORAL | Status: AC
  Administered 2016-06-15: 50 mg via ORAL
  Filled 2016-06-15: qty 1

## 2016-06-15 MED ORDER — METRONIDAZOLE 500 MG PO TABS: 500.0000 mg | ORAL_TABLET | Freq: Two times a day (BID) | ORAL | 0 refills | Status: DC

## 2016-06-15 NOTE — MAU Provider Note (Signed)
History     CSN: [id number]  Arrival date and time: 06/15/16 1352   None     Chief Complaint  Patient presents with  . Abdominal Pain   HPI Brianna Garrett is a 30 y.o. female who presents for abdominal pain. Pt had a full work up including labs, ultrasound, & STD testing this morning at [facility]. Was diagnosed with BV & 1.4 cm left ovarian cyst. Presents here stating "they told me to come to [facility] and make an appointment for surgery to remove my cyst". Denies worsening abdominal pain. Denies n/v/d, constipation, fever, or bleeding.   Past Medical History:  Diagnosis Date  . Allergy   . Asthma     Past Surgical History:  Procedure Laterality Date  . HIP SURGERY      No family history on file.  Social History  Substance Use Topics  . Smoking status: Current Every Day Smoker  . Smokeless tobacco: Never Used  . Alcohol use 0.0 oz/week    Allergies:  Allergies  Allergen Reactions  . Claritin [Loratadine]     "nose bleeds"  . Flonase [Fluticasone Propionate]     "nose bleeds"  . Pineapple     Prescriptions Prior to Admission  Medication Sig Dispense Refill Last Dose  . albuterol (PROVENTIL HFA;VENTOLIN HFA) 108 (90 Base) MCG/ACT inhaler Inhale 2 puffs into the lungs every 4 (four) hours as needed for wheezing or shortness of breath (cough, shortness of breath or wheezing.). (Patient not taking: Reported on 06/15/2016) 1 Inhaler 11 Not Taking at Unknown time  . azithromycin (ZITHROMAX) 250 MG tablet Take 1 tablet (250 mg total) by mouth daily. Take first 2 tablets together, then 1 every day until finished. (Patient not taking: Reported on 06/15/2016) 6 tablet 0 Completed Course at Unknown time  . benzonatate (TESSALON) 100 MG capsule Take 1 capsule (100 mg total) by mouth 3 (three) times daily as needed for cough. (Patient not taking: Reported on 06/15/2016) 21 capsule 0 Completed Course at Unknown time  . metroNIDAZOLE (FLAGYL) 500 MG tablet Take 1 tablet (500 mg total) by  mouth 2 (two) times daily. 14 tablet 0   . predniSONE (DELTASONE) 10 MG tablet 6,5,4,3,2,1 taper (Patient not taking: Reported on 06/15/2016) 21 tablet 0 Completed Course at Unknown time  . traMADol (ULTRAM) 50 MG tablet Take 1 tablet (50 mg total) by mouth every 6 (six) hours as needed. 15 tablet 0     Review of Systems  Gastrointestinal: Positive for abdominal pain.  Genitourinary: Negative.    Physical Exam   Blood pressure 136/66, pulse 85, temperature 97.8 F (36.6 C), resp. rate 18, last menstrual period 05/24/2016.  Physical Exam  Nursing note and vitals reviewed. Constitutional: She is oriented to person, place, and time. She appears well-developed and well-nourished. No distress.  HENT:  Head: Normocephalic and atraumatic.  Eyes: Conjunctivae are normal. Right eye exhibits no discharge. Left eye exhibits no discharge. No scleral icterus.  Neck: Normal range of motion.  Respiratory: Effort normal. No respiratory distress.  GI: Soft. Bowel sounds are normal. She exhibits no distension. There is tenderness in the left lower quadrant. There is no rebound and no guarding.  Musculoskeletal: Normal range of motion.  Neurological: She is alert and oriented to person, place, and time.  Skin: Skin is warm and dry. She is not diaphoretic.  Psychiatric: She has a normal mood and affect. Her behavior is normal. Judgment and thought content normal.    MAU Course  Procedures  MDM Negative UPT at ED Ultrasound in ED shows 1.4 cm cyst, no evidence of torsion Pt denies worsening symptoms  Assessment and Plan  A: 1. Left lower quadrant pain   2. Left ovarian cyst    P: Discharge home Continue taking medications previously prescribed Take ibuprofen PRN [facility] to make appt If symptoms worsen or new symptoms develop, go to ED  Judeth HornErin Larayne Baxley 06/15/2016, 2:50 PM

## 2016-06-15 NOTE — ED Notes (Signed)
Pt back from [facility]

## 2016-06-15 NOTE — Discharge Instructions (Signed)
Abdominal Pain, Adult °Many things can cause abdominal pain. Usually, abdominal pain is not caused by a disease and will improve without treatment. It can often be observed and treated at home. Your health care provider will do a physical exam and possibly order blood tests and X-rays to help determine the seriousness of your pain. However, in many cases, more time must pass before a clear cause of the pain can be found. Before that point, your health care provider may not know if you need more testing or further treatment. °HOME CARE INSTRUCTIONS °Monitor your abdominal pain for any changes. The following actions may help to alleviate any discomfort you are experiencing: °· Only take over-the-counter or prescription medicines as directed by your health care provider. °· Do not take laxatives unless directed to do so by your health care provider. °· Try a clear liquid diet (broth, tea, or water) as directed by your health care provider. Slowly move to a bland diet as tolerated. °SEEK MEDICAL CARE IF: °· You have unexplained abdominal pain. °· You have abdominal pain associated with nausea or diarrhea. °· You have pain when you urinate or have a bowel movement. °· You experience abdominal pain that wakes you in the night. °· You have abdominal pain that is worsened or improved by eating food. °· You have abdominal pain that is worsened with eating fatty foods. °· You have a fever. °SEEK IMMEDIATE MEDICAL CARE IF: °· Your pain does not go away within 2 hours. °· You keep throwing up (vomiting). °· Your pain is felt only in portions of the abdomen, such as the right side or the left lower portion of the abdomen. °· You pass bloody or black tarry stools. °MAKE SURE YOU: °· Understand these instructions. °· Will watch your condition. °· Will get help right away if you are not doing well or get worse. °  °This information is not intended to replace advice given to you by your health care provider. Make sure you discuss  any questions you have with your health care provider. °  °Document Released: 06/03/2005 Document Revised: 05/15/2015 Document Reviewed: 05/03/2013 °Elsevier Interactive Patient Education ©2016 Elsevier Inc[employer] °Ovarian Cyst °An ovarian cyst is a fluid-filled sac that forms on an ovary. The ovaries are small organs that produce eggs in women. Various types of cysts can form on the ovaries. Most are not cancerous. Many do not cause problems, and they often go away on their own. Some may cause symptoms and require treatment. Common types of ovarian cysts include: °· Functional cysts--These cysts may occur every month during the menstrual cycle. This is normal. The cysts usually go away with the next menstrual cycle if the woman does not get pregnant. Usually, there are no symptoms with a functional cyst. °· Endometrioma cysts--These cysts form from the tissue that lines the uterus. They are also called "chocolate cysts" because they become filled with blood that turns brown. This type of cyst can cause pain in the lower abdomen during intercourse and with your menstrual period. °· Cystadenoma cysts--This type develops from the cells on the outside of the ovary. These cysts can get very big and cause lower abdomen pain and pain with intercourse. This type of cyst can twist on itself, cut off its blood supply, and cause severe pain. It can also easily rupture and cause a lot of pain. °· Dermoid cysts--This type of cyst is sometimes found in both ovaries. These cysts may contain different kinds of body tissue, such as   skin, teeth, hair, or cartilage. They usually do not cause symptoms unless they get very big. °· Theca lutein cysts--These cysts occur when too much of a certain hormone (human chorionic gonadotropin) is produced and overstimulates the ovaries to produce an egg. This is most common after procedures used to assist with the conception of a baby (in vitro fertilization). °CAUSES  °· Fertility drugs can cause a  condition in which multiple large cysts are formed on the ovaries. This is called ovarian hyperstimulation syndrome. °· A condition called polycystic ovary syndrome can cause hormonal imbalances that can lead to nonfunctional ovarian cysts. °SIGNS AND SYMPTOMS  °Many ovarian cysts do not cause symptoms. If symptoms are present, they may include: °· Pelvic pain or pressure. °· Pain in the lower abdomen. °· Pain during sexual intercourse. °· Increasing girth (swelling) of the abdomen. °· Abnormal menstrual periods. °· Increasing pain with menstrual periods. °· Stopping having menstrual periods without being pregnant. °DIAGNOSIS  °These cysts are commonly found during a routine or annual pelvic exam. Tests may be ordered to find out more about the cyst. These tests may include: °· Ultrasound. °· X-ray of the pelvis. °· CT scan. °· MRI. °· Blood tests. °TREATMENT  °Many ovarian cysts go away on their own without treatment. Your health care provider may want to check your cyst regularly for 2-3 months to see if it changes. For women in menopause, it is particularly important to monitor a cyst closely because of the higher rate of ovarian cancer in menopausal women. When treatment is needed, it may include any of the following: °· A procedure to drain the cyst (aspiration). This may be done using a long needle and ultrasound. It can also be done through a laparoscopic procedure. This involves using a thin, lighted tube with a tiny camera on the end (laparoscope) inserted through a small incision. °· Surgery to remove the whole cyst. This may be done using laparoscopic surgery or an open surgery involving a larger incision in the lower abdomen. °· Hormone treatment or birth control pills. These methods are sometimes used to help dissolve a cyst. °HOME CARE INSTRUCTIONS  °· Only take over-the-counter or prescription medicines as directed by your health care provider. °· Follow up with your health care provider as  directed. °· Get regular pelvic exams and Pap tests. °SEEK MEDICAL CARE IF:  °· Your periods are late, irregular, or painful, or they stop. °· Your pelvic pain or abdominal pain does not go away. °· Your abdomen becomes larger or swollen. °· You have pressure on your bladder or trouble emptying your bladder completely. °· You have pain during sexual intercourse. °· You have feelings of fullness, pressure, or discomfort in your stomach. °· You lose weight for no apparent reason. °· You feel generally ill. °· You become constipated. °· You lose your appetite. °· You develop acne. °· You have an increase in body and facial hair. °· You are gaining weight, without changing your exercise and eating habits. °· You think you are pregnant. °SEEK IMMEDIATE MEDICAL CARE IF:  °· You have increasing abdominal pain. °· You feel sick to your stomach (nauseous), and you throw up (vomit). °· You develop a fever that comes on suddenly. °· You have abdominal pain during a bowel movement. °· Your menstrual periods become heavier than usual. °MAKE SURE YOU: °· Understand these instructions. °· Will watch your condition. °· Will get help right away if you are not doing well or get worse. °  °  This information is not intended to replace advice given to you by your health care provider. Make sure you discuss any questions you have with your health care provider. °  °Document Released: 08/24/2005 Document Revised: 08/29/2013 Document Reviewed: 05/01/2013 °Elsevier Interactive Patient Education ©2016 Elsevier Inc. ° °

## 2016-06-15 NOTE — MAU Note (Signed)
Pt presents to MAU with complaints of having pain in the left lower area of her back and abdomen since January. [facility] and was told she had a cyst on her ovary and BV. States she was told to come here if the pain got worse. Denies any vaginal bleeding

## 2016-06-15 NOTE — ED Notes (Signed)
Patient transported to X-ray 

## 2016-09-12 ENCOUNTER — Ambulatory Visit (HOSPITAL_COMMUNITY)
Admission: EM | Admit: 2016-09-12 | Discharge: 2016-09-12 | Disposition: A | Discharge status: Home / Self Care | Payer: BLUE CROSS/BLUE SHIELD | Attending: Family Medicine | Admitting: Family Medicine

## 2016-09-12 ENCOUNTER — Encounter (HOSPITAL_COMMUNITY): Payer: Self-pay | Admitting: *Deleted

## 2016-09-12 DIAGNOSIS — R69 Illness, unspecified: Secondary | ICD-10-CM

## 2016-09-12 DIAGNOSIS — J111 Influenza due to unidentified influenza virus with other respiratory manifestations: Secondary | ICD-10-CM

## 2016-09-12 MED ORDER — ALBUTEROL SULFATE HFA 108 (90 BASE) MCG/ACT IN AERS: 2.0000 | INHALATION_SPRAY | Freq: Four times a day (QID) | RESPIRATORY_TRACT | 1 refills | Status: DC | PRN

## 2016-09-12 MED ORDER — METHYLPREDNISOLONE SODIUM SUCC 125 MG IJ SOLR
INTRAMUSCULAR | Status: AC
  Filled 2016-09-12: qty 2

## 2016-09-12 MED ORDER — METHYLPREDNISOLONE SODIUM SUCC 125 MG IJ SOLR
125.0000 mg | Freq: Once | INTRAMUSCULAR | Status: AC
  Administered 2016-09-12: 125 mg via INTRAMUSCULAR

## 2016-09-12 MED ORDER — IPRATROPIUM BROMIDE 0.06 % NA SOLN: 2.0000 | Freq: Four times a day (QID) | NASAL | 1 refills | Status: DC

## 2016-09-12 MED ORDER — IPRATROPIUM BROMIDE 0.02 % IN SOLN
RESPIRATORY_TRACT | Status: AC
  Filled 2016-09-12: qty 2.5

## 2016-09-12 MED ORDER — ALBUTEROL SULFATE (2.5 MG/3ML) 0.083% IN NEBU
5.0000 mg | INHALATION_SOLUTION | Freq: Once | RESPIRATORY_TRACT | Status: AC
  Administered 2016-09-12: 5 mg via RESPIRATORY_TRACT

## 2016-09-12 MED ORDER — GUAIFENESIN-CODEINE 100-10 MG/5ML PO SYRP: 10.0000 mL | ORAL_SOLUTION | Freq: Four times a day (QID) | ORAL | 0 refills | Status: DC | PRN

## 2016-09-12 MED ORDER — IPRATROPIUM BROMIDE 0.02 % IN SOLN
0.5000 mg | Freq: Once | RESPIRATORY_TRACT | Status: AC
  Administered 2016-09-12: 0.5 mg via RESPIRATORY_TRACT

## 2016-09-12 MED ORDER — ALBUTEROL SULFATE (2.5 MG/3ML) 0.083% IN NEBU
INHALATION_SOLUTION | RESPIRATORY_TRACT | Status: AC
  Filled 2016-09-12: qty 6

## 2016-09-12 NOTE — ED Provider Notes (Signed)
[facility]    CSN: [id number] Arrival date & time: 09/12/16  1625     History   Chief Complaint Chief Complaint  Patient presents with  . Cough    HPI Brianna Garrett is a 31 y.o. female.   The history is provided by the patient.  Cough  Cough characteristics:  Non-productive Severity:  Mild Onset quality:  Sudden Duration:  3 days Progression:  Unchanged Chronicity:  New Smoker: no   Context: sick contacts, upper respiratory infection and weather changes   Relieved by:  None tried Worsened by:  Nothing Associated symptoms: rhinorrhea   Associated symptoms: no fever, no shortness of breath and no wheezing     Past Medical History:  Diagnosis Date  . Allergy   . Asthma     There are no active problems to display for this patient.   Past Surgical History:  Procedure Laterality Date  . HIP SURGERY      OB History    No data available       Home Medications    Prior to Admission medications   Medication Sig Start Date End Date Taking? Authorizing Provider  albuterol (PROVENTIL HFA;VENTOLIN HFA) 108 (90 Base) MCG/ACT inhaler Inhale 2 puffs into the lungs every 6 (six) hours as needed for wheezing or shortness of breath. 09/12/16   Linna Hoff, MD  guaiFENesin-codeine [facility]) 100-10 MG/5ML syrup Take 10 mLs by mouth 4 (four) times daily as needed for cough. 09/12/16   Linna Hoff, MD  ipratropium (ATROVENT) 0.06 % nasal spray Place 2 sprays into both nostrils 4 (four) times daily. 09/12/16   Linna Hoff, MD  metroNIDAZOLE (FLAGYL) 500 MG tablet Take 1 tablet (500 mg total) by mouth 2 (two) times daily. 06/15/16   Tiffany Neva Seat, PA-C  traMADol (ULTRAM) 50 MG tablet Take 1 tablet (50 mg total) by mouth every 6 (six) hours as needed. 06/15/16   Marlon Pel, PA-C    Family History History reviewed. No pertinent family history.  Social History Social History  Substance Use Topics  . Smoking status: Current Every Day Smoker  .  Smokeless tobacco: Never Used  . Alcohol use 0.0 oz/week     Allergies   Claritin [loratadine]; Flonase [fluticasone propionate]; and Pineapple   Review of Systems Review of Systems  Constitutional: Negative.  Negative for fever.  HENT: Positive for congestion and rhinorrhea.   Respiratory: Positive for cough. Negative for shortness of breath and wheezing.   Cardiovascular: Negative.   Genitourinary: Negative.   All other systems reviewed and are negative.    Physical Exam Triage Vital Signs ED Triage Vitals  Enc Vitals Group     BP 09/12/16 1736 121/81     Pulse Rate 09/12/16 1736 81     Resp 09/12/16 1736 14     Temp 09/12/16 1736 98.6 F (37 C)     Temp Source 09/12/16 1736 Oral     SpO2 09/12/16 1736 100 %     Weight --      Height --      Head Circumference --      Peak Flow --      Pain Score 09/12/16 1742 5     Pain Loc --      Pain Edu? --      Excl. in GC? --    No data found.   Updated Vital Signs BP 121/81 (BP Location: Right Arm)   Pulse 81   Temp  98.6 F (37 C) (Oral)   Resp 14   LMP 08/23/2016 Comment: polycystic  ovarian   cyst  SpO2 100%   Visual Acuity Right Eye Distance:   Left Eye Distance:   Bilateral Distance:    Right Eye Near:   Left Eye Near:    Bilateral Near:     Physical Exam  Constitutional: She is oriented to person, place, and time. She appears well-developed and well-nourished. No distress.  HENT:  Right Ear: External ear normal.  Left Ear: External ear normal.  Nose: Nose normal.  Mouth/Throat: Oropharynx is clear and moist.  Eyes: Conjunctivae and EOM are normal. Pupils are equal, round, and reactive to light.  Neck: Normal range of motion. Neck supple.  Cardiovascular: Normal rate, regular rhythm, normal heart sounds and intact distal pulses.   Pulmonary/Chest: Effort normal. She has wheezes. She has no rales.  Abdominal: Soft. Bowel sounds are normal.  Lymphadenopathy:    She has no cervical adenopathy.    Neurological: She is alert and oriented to person, place, and time.  Skin: Skin is warm and dry.  Nursing note and vitals reviewed.    UC Treatments / Results  Labs (all labs ordered are listed, but only abnormal results are displayed) Labs Reviewed - No data to display  EKG  EKG Interpretation None       Radiology No results found.  Procedures Procedures (including critical care time)  Medications Ordered in UC Medications  ipratropium (ATROVENT) nebulizer solution 0.5 mg (0.5 mg Nebulization Given 09/12/16 1800)  albuterol (PROVENTIL) (2.5 MG/3ML) 0.083% nebulizer solution 5 mg (5 mg Nebulization Given 09/12/16 1800)  methylPREDNISolone sodium succinate (SOLU-MEDROL) 125 mg/2 mL injection 125 mg (125 mg Intramuscular Given 09/12/16 1757)     Initial Impression / Assessment and Plan / UC Course  I have reviewed the triage vital signs and the nursing notes.  Pertinent labs & imaging results that were available during my care of the patient were reviewed by me and considered in my medical decision making (see chart for details).    Lungs clear, sx improved at d/c.   Final Clinical Impressions(s) / UC Diagnoses   Final diagnoses:  Influenza-like illness    New Prescriptions Discharge Medication List as of 09/12/2016  6:36 PM    START taking these medications   Details  guaiFENesin-codeine (ROBITUSSIN AC) 100-10 MG/5ML syrup Take 10 mLs by mouth 4 (four) times daily as needed for cough., Starting Sat 09/12/2016, Print    ipratropium (ATROVENT) 0.06 % nasal spray Place 2 sprays into both nostrils 4 (four) times daily., Starting Sat 09/12/2016, Print         Linna HoffJames D Wynema Garoutte, MD 10/04/16 [phone]

## 2016-09-12 NOTE — ED Triage Notes (Signed)
Pt  Has   Cough   Tightness in   Chest   Out  Of  Inhaler  Headache   Dizzy  Spells  And  Runny  Nose

## 2017-02-02 ENCOUNTER — Ambulatory Visit (INDEPENDENT_AMBULATORY_CARE_PROVIDER_SITE_OTHER): Payer: Self-pay

## 2017-02-02 ENCOUNTER — Ambulatory Visit (INDEPENDENT_AMBULATORY_CARE_PROVIDER_SITE_OTHER): Payer: Self-pay | Admitting: Physician Assistant

## 2017-02-02 ENCOUNTER — Encounter: Payer: Self-pay | Admitting: Physician Assistant

## 2017-02-02 VITALS — BP 127/81 | HR 76 | Temp 98.1°F | Resp 17 | Ht 70.5 in | Wt 198.0 lb

## 2017-02-02 DIAGNOSIS — R058 Other specified cough: Secondary | ICD-10-CM

## 2017-02-02 DIAGNOSIS — J45901 Unspecified asthma with (acute) exacerbation: Secondary | ICD-10-CM

## 2017-02-02 DIAGNOSIS — R05 Cough: Secondary | ICD-10-CM

## 2017-02-02 DIAGNOSIS — R062 Wheezing: Secondary | ICD-10-CM

## 2017-02-02 LAB — POCT CBC
Granulocyte percent: 59.7 %G (ref 37–80)
HCT, POC: 38.3 % (ref 37.7–47.9)
HEMOGLOBIN: 13.5 g/dL (ref 12.2–16.2)
LYMPH, POC: 2.1 (ref 0.6–3.4)
MCH, POC: 32.3 pg — AB (ref 27–31.2)
MCHC: 35.2 g/dL (ref 31.8–35.4)
MCV: 91.8 fL (ref 80–97)
MID (cbc): 0.7 (ref 0–0.9)
MPV: 8.5 fL (ref 0–99.8)
POC Granulocyte: 4.1 (ref 2–6.9)
POC LYMPH PERCENT: 30.4 %L (ref 10–50)
POC MID %: 9.9 %M (ref 0–12)
Platelet Count, POC: 206 10*3/uL (ref 142–424)
RBC: 4.17 M/uL (ref 4.04–5.48)
RDW, POC: 14.7 %
WBC: 6.9 10*3/uL (ref 4.6–10.2)

## 2017-02-02 MED ORDER — MONTELUKAST SODIUM 10 MG PO TABS: 10.0000 mg | ORAL_TABLET | Freq: Every day | ORAL | 11 refills | Status: DC

## 2017-02-02 MED ORDER — GUAIFENESIN ER 1200 MG PO TB12: 1.0000 | ORAL_TABLET | Freq: Two times a day (BID) | ORAL | 1 refills | Status: DC | PRN

## 2017-02-02 MED ORDER — AZITHROMYCIN 250 MG PO TABS: ORAL_TABLET | ORAL | 0 refills | Status: DC

## 2017-02-02 MED ORDER — PREDNISONE 20 MG PO TABS: ORAL_TABLET | ORAL | 0 refills | Status: DC

## 2017-02-02 NOTE — Progress Notes (Signed)
[facility] [address], [place] [address] 336 [phone]  Date:  02/02/2017   Name:  Brianna Garrett   DOB:  1986-04-16   MRN:  [id number]  PCP:  Patient, No Pcp Per    History of Present Illness:  Brianna Garrett is a 31 y.o. female patient who presents to PCP with  Chief Complaint  Patient presents with  . Cough  . URI  . Sore Throat     2 weeks ago, she started with trouble with breathing, and dizziness.  She had an asthma attack.  She took her inhaler.  She developed sore throat, nasal drainage 2 days ago.  She is not sleeping due to the breathing.  She is coughing up yellow sputum, or blood.  Mild sneezing.  She is not taking anything for her current allergies.  No fevers.  No ear discomofrt.  She still has the dizziness at times.     She stopped smoking 2 weeks ago. Drinks 1 gallon per day.    She has been using her inhaler twice in the morning and during the day.  When she is not sick, she is not using her inahler.    There are no active problems to display for this patient.   Past Medical History:  Diagnosis Date  . Allergy   . Asthma     Past Surgical History:  Procedure Laterality Date  . HIP SURGERY      Social History  Substance Use Topics  . Smoking status: Former Smoker    Quit date: 01/23/2017  . Smokeless tobacco: Never Used  . Alcohol use 0.0 oz/week    No family history on file.  Allergies  Allergen Reactions  . Claritin [Loratadine]     "nose bleeds"  . Flonase [Fluticasone Propionate]     "nose bleeds"  . Pineapple     Medication list has been reviewed and updated.  Current Outpatient Prescriptions on File Prior to Visit  Medication Sig Dispense Refill  . albuterol (PROVENTIL HFA;VENTOLIN HFA) 108 (90 Base) MCG/ACT inhaler Inhale 2 puffs into the lungs every 6 (six) hours as needed for wheezing or shortness of breath. 1 Inhaler 1  . guaiFENesin-codeine (ROBITUSSIN AC) 100-10 MG/5ML syrup Take 10 mLs by mouth 4  (four) times daily as needed for cough. 180 mL 0  . ipratropium (ATROVENT) 0.06 % nasal spray Place 2 sprays into both nostrils 4 (four) times daily. 15 mL 1  . metroNIDAZOLE (FLAGYL) 500 MG tablet Take 1 tablet (500 mg total) by mouth 2 (two) times daily. 14 tablet 0  . traMADol (ULTRAM) 50 MG tablet Take 1 tablet (50 mg total) by mouth every 6 (six) hours as needed. 15 tablet 0   No current facility-administered medications on file prior to visit.     ROS ROS otherwise unremarkable unless listed above.  Physical Examination: BP 127/81   Pulse 76   Temp 98.1 F (36.7 C) (Oral)   Resp 17   Ht 5' 10.5" (1.791 m)   Wt 198 lb (89.8 kg)   LMP 01/31/2017 (Approximate)   SpO2 98%   BMI 28.01 kg/m  Ideal Body Weight: Weight in (lb) to have BMI = 25: 176.4  Physical Exam  Constitutional: She is oriented to person, place, and time. She appears well-developed and well-nourished. No distress.  HENT:  Head: Normocephalic and atraumatic.  Right Ear: External ear normal.  Left Ear: External ear normal.  Eyes: Conjunctivae and EOM are normal. Pupils  are equal, round, and reactive to light.  Cardiovascular: Normal rate, regular rhythm and normal heart sounds.  Exam reveals no friction rub.   No murmur heard. Pulmonary/Chest: Effort normal. No accessory muscle usage. No apnea. No respiratory distress. She has no decreased breath sounds. She has no wheezes. She has rhonchi.  Neurological: She is alert and oriented to person, place, and time.  Skin: She is not diaphoretic.  Psychiatric: She has a normal mood and affect. Her behavior is normal.    Results for orders placed or performed in visit on 02/02/17  POCT CBC  Result Value Ref Range   WBC 6.9 4.6 - 10.2 K/uL   Lymph, poc 2.1 0.6 - 3.4   POC LYMPH PERCENT 30.4 10 - 50 %L   MID (cbc) 0.7 0 - 0.9   POC MID % 9.9 0 - 12 %M   POC Granulocyte 4.1 2 - 6.9   Granulocyte percent 59.7 37 - 80 %G   RBC 4.17 4.04 - 5.48 M/uL   Hemoglobin  13.5 12.2 - 16.2 g/dL   HCT, POC 16.1 09.6 - 47.9 %   MCV 91.8 80 - 97 fL   MCH, POC 32.3 (A) 27 - 31.2 pg   MCHC 35.2 31.8 - 35.4 g/dL   RDW, POC 04.5 %   Platelet Count, POC 206 142 - 424 K/uL   MPV 8.5 0 - 99.8 fL   Dg Chest 2 View  Result Date: 02/02/2017 CLINICAL DATA:  Productive cough with blood-tinged sputum. Noisy respiration. EXAM: CHEST  2 VIEW COMPARISON:  Chest x-ray of October 09, 2015 FINDINGS: The lungs are well-expanded and clear. The heart and pulmonary vascularity are normal. The mediastinum is normal in width. There is no pleural effusion or pneumothorax. The bony thorax is unremarkable. IMPRESSION: There is no pneumonia nor other acute cardiopulmonary abnormality. Electronically Signed   By: David  [place] M.D.   On: 02/02/2017 10:46     Assessment and Plan: Brianna Garrett is a 31 y.o. female who is here today for cc of cough, sore throat, There are no diagnoses linked to this encounter. Productive cough - Plan: POCT CBC, DG Chest 2 View, azithromycin (ZITHROMAX) 250 MG tablet  Wheezing - Plan: azithromycin (ZITHROMAX) 250 MG tablet  Trena Platt, PA-C [facility] 5/29/201812:26 PM  Trena Platt, PA-C [facility] 02/02/2017 10:17 AM

## 2017-02-02 NOTE — Patient Instructions (Addendum)
This does not look like pneumonia, however we will treat as a lower respiratory infection.  Please start the prednisone immediately after you leave here.   You can do the prednisone every 6 hours routinely for the next 24 hours.   The azithromycin will also treat for possible strep as well.   Take the antibiotic to completion.     IF you received an x-ray today, you will receive an invoice from [facility]. Please contact [facility] at [phone] with questions or concerns regarding your invoice.   IF you received labwork today, you will receive an invoice from [facility]. Please contact [facility] at [phone] with questions or concerns regarding your invoice.   Our billing staff will not be able to assist you with questions regarding bills from these companies.  You will be contacted with the lab results as soon as they are available. The fastest way to get your results is to activate your My Chart account. Instructions are located on the last page of this paperwork. If you have not heard from us regarding the results in 2 weeks, please contact this office.

## 2017-05-04 IMAGING — US US TRANSVAGINAL NON-OB
1 series · 13 of 25 positions shown · non-contrast
Comparison: None

CLINICAL DATA: Acute onset of left-sided pelvic pain. Initial
encounter.

EXAM:
TRANSABDOMINAL AND TRANSVAGINAL ULTRASOUND OF PELVIS
TECHNIQUE: Both transabdominal and transvaginal ultrasound examinations of the
pelvis were performed. Transabdominal technique was performed for
global imaging of the pelvis including uterus, ovaries, adnexal
regions, and pelvic cul-de-sac. It was necessary to proceed with
endovaginal exam following the transabdominal exam to visualize the
ovaries in greater detail.

[Series 1: us transvaginal non-ob · 0.20mm/px · 13 of 77 slices shown]
[im 1/77]
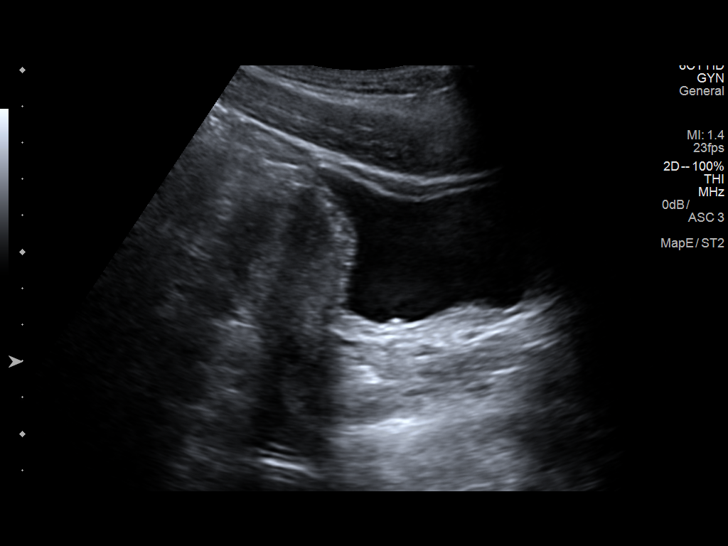
[im 7/77]
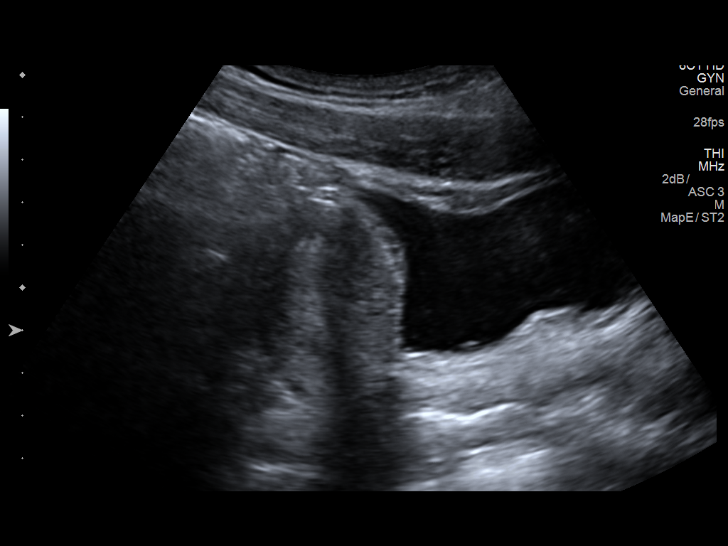
[im 13/77]
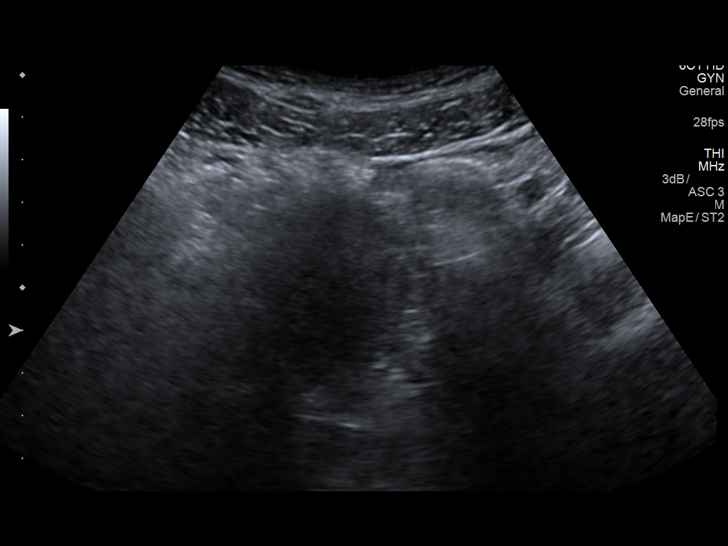
[im 20/77]
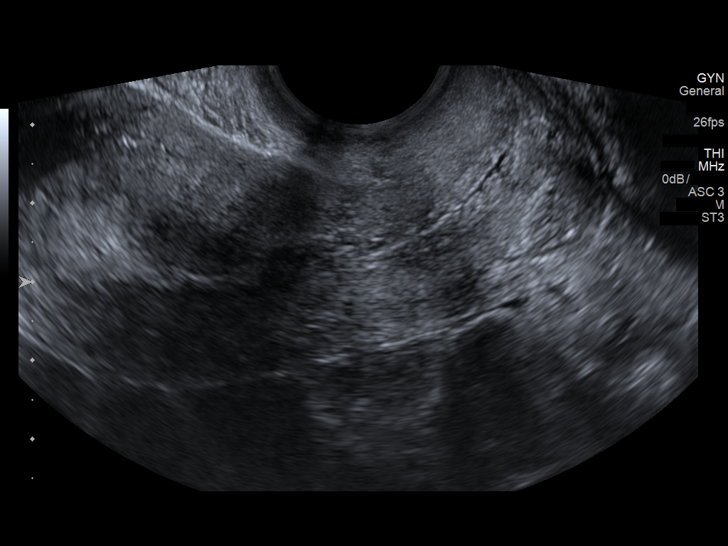
[im 26/77]
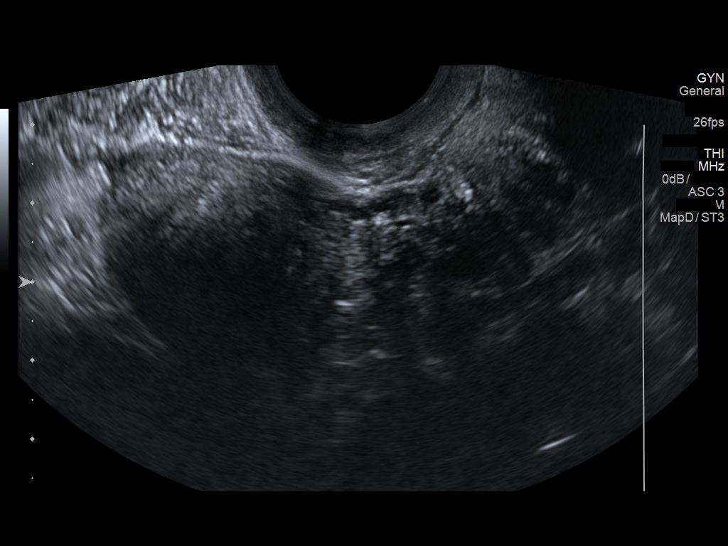
[im 32/77]
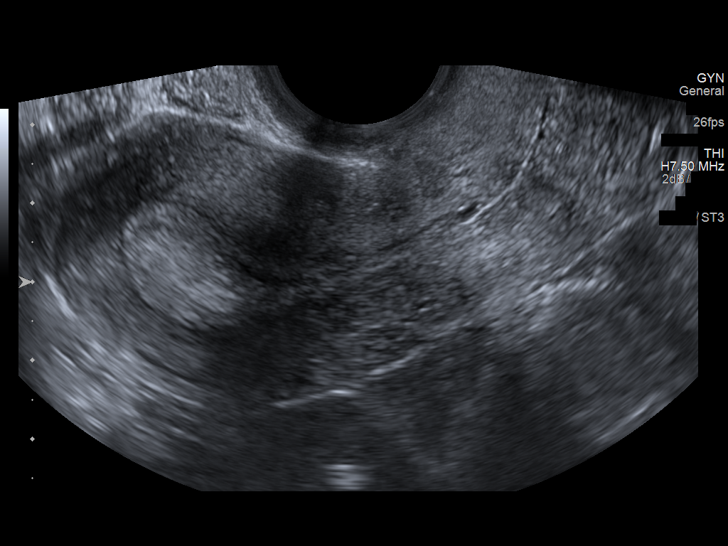
[im 39/77]
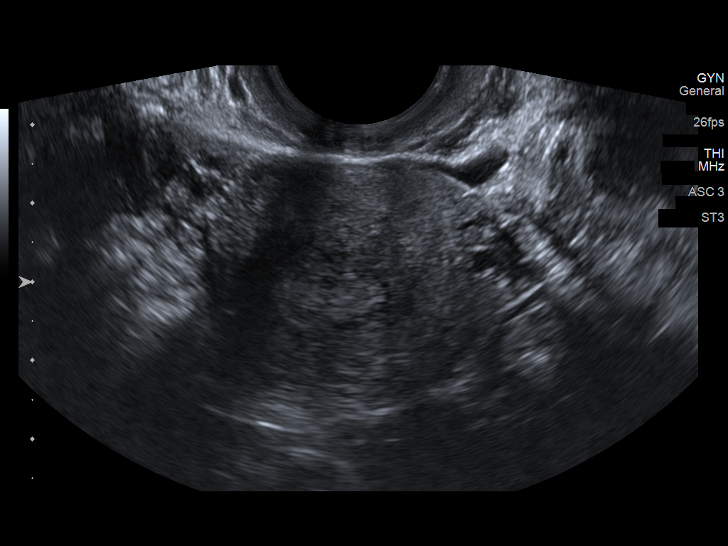
[im 45/77]
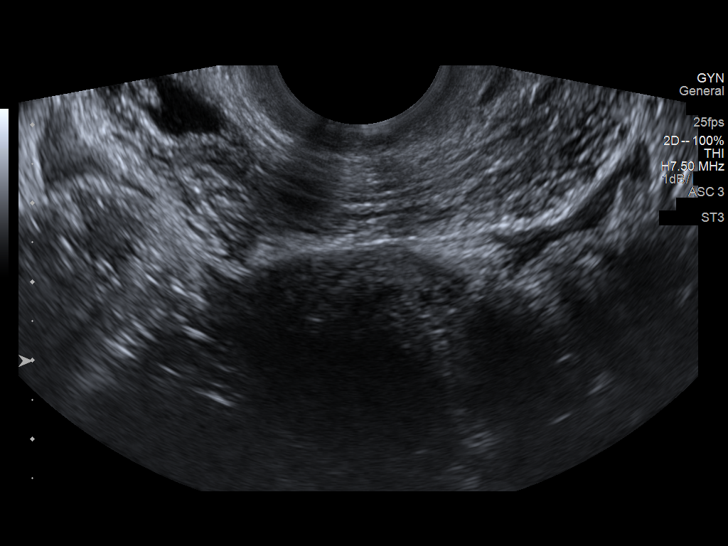
[im 51/77]
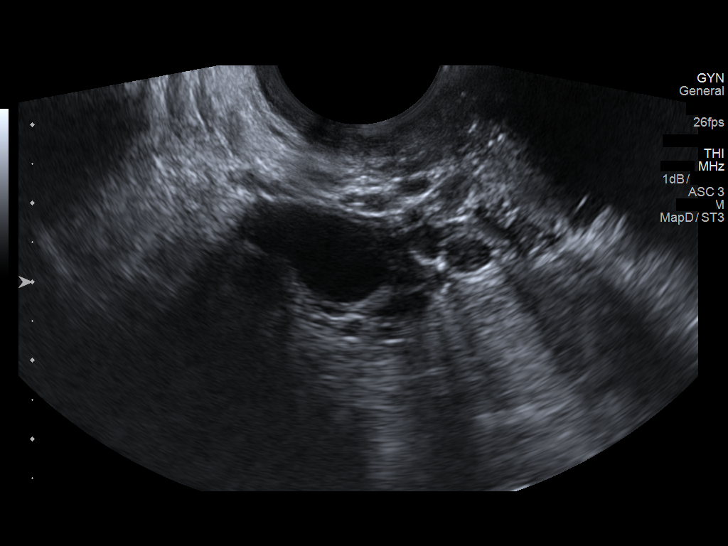
[im 58/77]
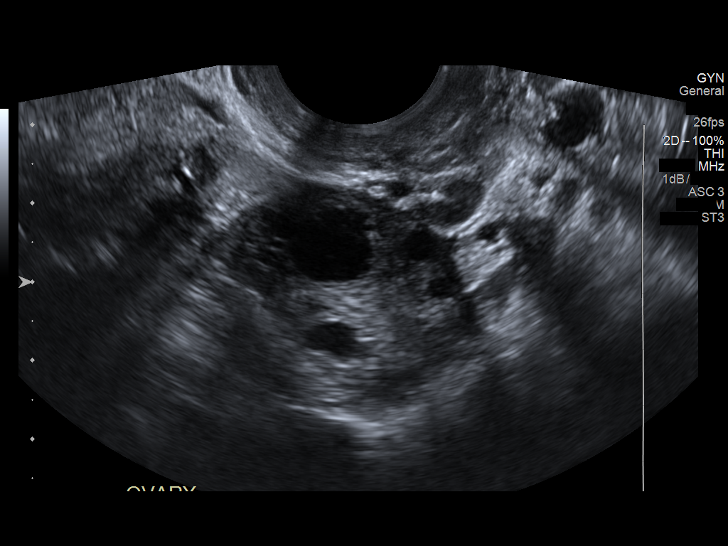
[im 64/77]
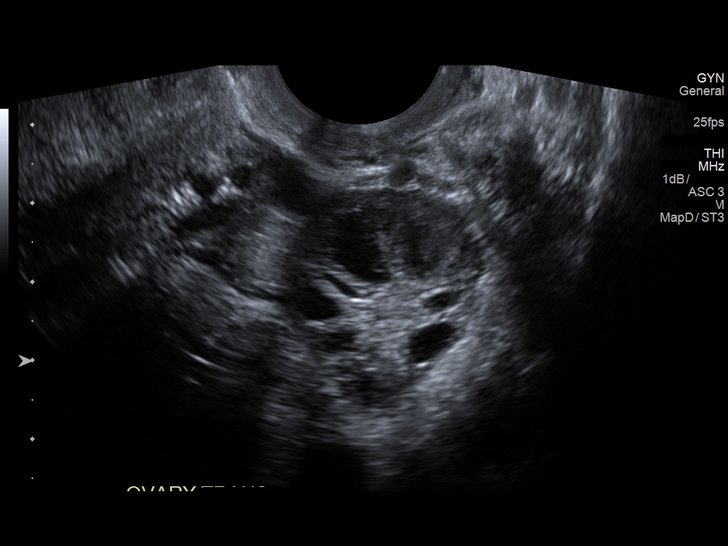
[im 70/77]
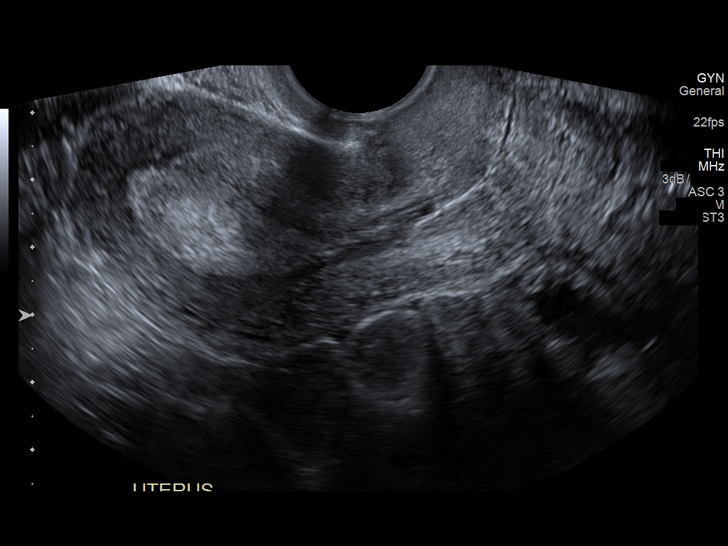
[im 77/77]
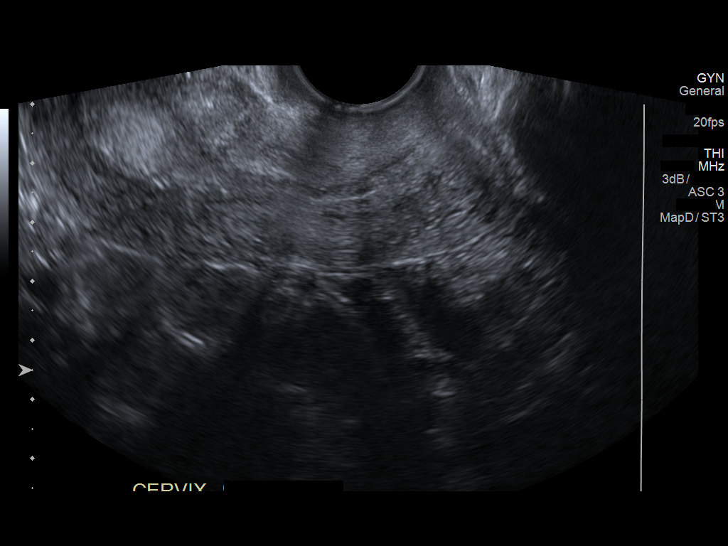

[13 of 25 positions shown; findings below may reference images not displayed]

FINDINGS: Uterus

Measurements: 7.4 x 3.5 x 4.2 cm. No fibroids or other mass
visualized.

Endometrium

Thickness: 1.0 cm.  No focal abnormality visualized.

Right ovary

Measurements: 2.9 x 1.7 x 2.5 cm. Normal appearance/no adnexal mass.

Left ovary

Measurements: 3.2 x 2.3 x 2.3 cm. A mildly complex 1.4 cm cystic
focus at the left ovary could reflect a small hemorrhagic cyst.

Limited Doppler evaluation demonstrates normal color Doppler blood
flow with respect to both ovaries. There is no evidence for ovarian
torsion.

Other findings

Trace free fluid is noted at the pelvic cul-de-sac.
IMPRESSION: 1. Uterus unremarkable in appearance.
2. No evidence for ovarian torsion.
3. Mildly complex 1.4 cm cystic focus at the left ovary could
reflect a small hemorrhagic cyst.

## 2017-08-04 ENCOUNTER — Other Ambulatory Visit: Payer: Self-pay

## 2017-08-04 ENCOUNTER — Encounter (HOSPITAL_COMMUNITY): Payer: Self-pay | Admitting: Emergency Medicine

## 2017-08-04 ENCOUNTER — Emergency Department (HOSPITAL_COMMUNITY)
Admission: EM | Admit: 2017-08-04 | Discharge: 2017-08-04 | Disposition: A | Discharge status: Home / Self Care | Payer: BLUE CROSS/BLUE SHIELD | Attending: Emergency Medicine | Admitting: Emergency Medicine

## 2017-08-04 DIAGNOSIS — R590 Localized enlarged lymph nodes: Secondary | ICD-10-CM | POA: Insufficient documentation

## 2017-08-04 DIAGNOSIS — H6693 Otitis media, unspecified, bilateral: Secondary | ICD-10-CM | POA: Insufficient documentation

## 2017-08-04 DIAGNOSIS — Z79899 Other long term (current) drug therapy: Secondary | ICD-10-CM | POA: Insufficient documentation

## 2017-08-04 DIAGNOSIS — R0982 Postnasal drip: Secondary | ICD-10-CM | POA: Insufficient documentation

## 2017-08-04 DIAGNOSIS — J45909 Unspecified asthma, uncomplicated: Secondary | ICD-10-CM | POA: Insufficient documentation

## 2017-08-04 DIAGNOSIS — H6690 Otitis media, unspecified, unspecified ear: Secondary | ICD-10-CM

## 2017-08-04 DIAGNOSIS — R6889 Other general symptoms and signs: Secondary | ICD-10-CM

## 2017-08-04 DIAGNOSIS — J039 Acute tonsillitis, unspecified: Secondary | ICD-10-CM | POA: Insufficient documentation

## 2017-08-04 DIAGNOSIS — K529 Noninfective gastroenteritis and colitis, unspecified: Secondary | ICD-10-CM

## 2017-08-04 DIAGNOSIS — M791 Myalgia, unspecified site: Secondary | ICD-10-CM | POA: Insufficient documentation

## 2017-08-04 DIAGNOSIS — Z87891 Personal history of nicotine dependence: Secondary | ICD-10-CM | POA: Insufficient documentation

## 2017-08-04 LAB — RAPID STREP SCREEN (MED CTR MEBANE ONLY): STREPTOCOCCUS, GROUP A SCREEN (DIRECT): NEGATIVE

## 2017-08-04 MED ORDER — AMOXICILLIN 500 MG PO CAPS: 500.0000 mg | ORAL_CAPSULE | Freq: Two times a day (BID) | ORAL | 0 refills | Status: AC

## 2017-08-04 MED ORDER — IBUPROFEN 800 MG PO TABS
800.0000 mg | ORAL_TABLET | Freq: Once | ORAL | Status: AC
  Administered 2017-08-04: 800 mg via ORAL
  Filled 2017-08-04: qty 1

## 2017-08-04 NOTE — ED Notes (Signed)
Care handoff to Chris RN 

## 2017-08-04 NOTE — ED Provider Notes (Signed)
[facility]ENC[facility] Provider Note   CSN: [id number] Arrival date & time: 08/04/17  [id number]     History   Chief Complaint Chief Complaint  Patient presents with  . Generalized Body Aches  . Sore Throat    HPI Brianna Garrett is a 31 y.o. female who presents today for evaluation of 2 days of sore throat, generally not feeling well, body aches, bilateral ear pain, nausea, diarrhea.  She reports that her entire family has similar symptoms.  She has not tried anything for symptom relief prior to arrival.  She reports that she is able to stay hydrated and keep down foods and fluids.  She reports that she works in [employer] and is unable to work with the symptoms.  She reports feeling like she is mildly off balance.  She has not fallen.  HPI  Past Medical History:  Diagnosis Date  . Allergy   . Asthma     There are no active problems to display for this patient.   Past Surgical History:  Procedure Laterality Date  . HIP SURGERY      OB History    No data available       Home Medications    Prior to Admission medications   Medication Sig Start Date End Date Taking? Authorizing Provider  albuterol (PROVENTIL HFA;VENTOLIN HFA) 108 (90 Base) MCG/ACT inhaler Inhale 2 puffs into the lungs every 6 (six) hours as needed for wheezing or shortness of breath. 09/12/16   Linna HoffKindl, James D, MD  amoxicillin (AMOXIL) 500 MG capsule Take 1 capsule (500 mg total) by mouth 2 (two) times daily for 7 days. 08/04/17 08/11/17  Cristina GongHammond, Haunani Dickard W, PA-C  azithromycin (ZITHROMAX) 250 MG tablet Take 2 tabs PO x 1 dose, then 1 tab PO QD x 4 days 02/02/17   English, Stephanie D, PA  Guaifenesin (MUCINEX MAXIMUM STRENGTH) 1200 MG TB12 Take 1 tablet (1,200 mg total) by mouth every 12 (twelve) hours as needed. 02/02/17   Trena PlattEnglish, Stephanie D, PA  guaiFENesin-codeine (ROBITUSSIN AC) 100-10 MG/5ML syrup Take 10 mLs by mouth 4 (four) times daily as needed for cough. 09/12/16    Linna HoffKindl, James D, MD  ipratropium (ATROVENT) 0.06 % nasal spray Place 2 sprays into both nostrils 4 (four) times daily. 09/12/16   Linna HoffKindl, James D, MD  metroNIDAZOLE (FLAGYL) 500 MG tablet Take 1 tablet (500 mg total) by mouth 2 (two) times daily. 06/15/16   Neva SeatGreene, Tiffany, PA-C  montelukast (SINGULAIR) 10 MG tablet Take 1 tablet (10 mg total) by mouth at bedtime. 02/02/17   Trena PlattEnglish, Stephanie D, PA  predniSONE (DELTASONE) 20 MG tablet Take 3 PO QAM x2days, 2 PO QAM x2days, 1 PO QAM x3days 02/02/17   English, Judeth CornfieldStephanie D, PA  traMADol (ULTRAM) 50 MG tablet Take 1 tablet (50 mg total) by mouth every 6 (six) hours as needed. 06/15/16   Marlon PelGreene, Tiffany, PA-C    Family History History reviewed. No pertinent family history.  Social History Social History   Tobacco Use  . Smoking status: Former Smoker    Last attempt to quit: 01/23/2017    Years since quitting: 0.5  . Smokeless tobacco: Never Used  Substance Use Topics  . Alcohol use: Yes    Alcohol/week: 0.0 oz  . Drug use: No     Allergies   Claritin [loratadine]; Flonase [fluticasone propionate]; and Pineapple   Review of Systems Review of Systems  Constitutional: Positive for fatigue and fever (Subjective). Negative for chills.  HENT: Positive for congestion, ear pain, postnasal drip, rhinorrhea and sore throat. Negative for drooling, facial swelling, hearing loss, tinnitus, trouble swallowing and voice change.   Eyes: Negative for photophobia and visual disturbance.  Respiratory: Negative for cough, chest tightness and shortness of breath.   Gastrointestinal: Positive for diarrhea, nausea and vomiting. Negative for abdominal distention and abdominal pain.  Genitourinary: Negative for decreased urine volume.  Musculoskeletal: Positive for myalgias. Negative for neck pain and neck stiffness.  Skin: Negative for rash.  Neurological: Positive for dizziness. Negative for weakness, light-headedness and numbness.     Physical Exam Updated  Vital Signs BP (!) 130/53 (BP Location: Right Arm)   Pulse 73   Temp 98.6 F (37 C) (Oral)   Resp 16   LMP 08/03/2017 (Exact Date)   SpO2 99%   Physical Exam  Constitutional: She is oriented to person, place, and time. She appears well-developed and well-nourished. No distress.  HENT:  Head: Normocephalic and atraumatic.  Right Ear: Hearing, external ear and ear canal normal. No drainage, swelling or tenderness. No foreign bodies. No mastoid tenderness. Tympanic membrane is erythematous and bulging. Tympanic membrane is not perforated.  Left Ear: Hearing and external ear normal. No drainage, swelling or tenderness. No foreign bodies. No mastoid tenderness. Tympanic membrane is bulging. Tympanic membrane is not perforated and not erythematous.  Mouth/Throat: Uvula is midline and mucous membranes are normal. No uvula swelling. Posterior oropharyngeal erythema present. No oropharyngeal exudate, posterior oropharyngeal edema or tonsillar abscesses. Tonsils are 2+ on the right. Tonsils are 2+ on the left. Tonsillar exudate.  Eyes: Conjunctivae are normal.  Neck: Normal range of motion. Neck supple.  Cardiovascular: Normal rate, regular rhythm, normal heart sounds and intact distal pulses.  No murmur heard. Pulmonary/Chest: Effort normal and breath sounds normal. No stridor. No respiratory distress. She has no wheezes. She has no rhonchi. She exhibits no tenderness.  Abdominal: Soft. Bowel sounds are normal. She exhibits no distension. There is no tenderness. There is no guarding.  Musculoskeletal: She exhibits no edema.  Lymphadenopathy:    She has cervical adenopathy (L>R).  Neurological: She is alert and oriented to person, place, and time. She displays a negative Romberg sign. Coordination and gait normal.  Normal finger to nose, normal gait, negative romberg.  Skin: Skin is warm and dry.  Psychiatric: She has a normal mood and affect. Her behavior is normal.  Nursing note and vitals  reviewed.    ED Treatments / Results  Labs (all labs ordered are listed, but only abnormal results are displayed) Labs Reviewed  RAPID STREP SCREEN (NOT [facility])  CULTURE, GROUP A STREP [facility])    EKG  EKG Interpretation None       Radiology No results found.  Procedures Procedures (including critical care time)  Medications Ordered in ED Medications  ibuprofen (ADVIL,MOTRIN) tablet 800 mg (800 mg Oral Given 08/04/17 1050)     Initial Impression / Assessment and Plan / ED Course  I have reviewed the triage vital signs and the nursing notes.  Pertinent labs & imaging results that were available during my care of the patient were reviewed by me and considered in my medical decision making (see chart for details).    Patient with symptoms consistent with influenza.  Vitals are stable, afebrile here.  No signs of dehydration, tolerating PO's.  Lungs are clear. Due to patient's presentation and physical exam a chest x-ray was not ordered bc likely diagnosis of flu.  The patient understands that symptoms  are greater than the recommended 24-48 hour window of treatment.  Patient will be discharged with instructions to orally hydrate, rest, and use over-the-counter medications such as anti-inflammatories ibuprofen and Aleve for muscle aches and Tylenol for fever. Right-sided TM is bulging and red, consistent with otitis media.  Left-sided TM is bulging, however not erythema.  Will treat with amoxicillin.  Patient without obvious coordination or balance deficits.  Suspect that subjective feeling of being slightly off balance is secondary to otitis media. Patient is able to handle her secretions well, able to drink water in the ER without difficulties.  She was instructed on the importance of remaining hydrated.  She was informed that she has a rapid strep pending.  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  She was given strict return precautions  and states her understanding.  She was given the option twice questions, all of which were answered to the best of my abilities.     Final Clinical Impressions(s) / ED Diagnoses   Final diagnoses:  Flu-like symptoms  Acute otitis media, unspecified otitis media type  Gastroenteritis    ED Discharge Orders        Ordered    amoxicillin (AMOXIL) 500 MG capsule  2 times daily     08/04/17 1153       Cristina Gong, [place] 08/04/17 1223    Cathren Laine, MD 08/04/17 1325

## 2017-08-04 NOTE — ED Triage Notes (Signed)
Pt  Here with c/o sore throat and general body aches that started 2 days ago , pt does have red throat ,no swelling , noted

## 2017-08-04 NOTE — Discharge Instructions (Addendum)

## 2017-08-06 LAB — CULTURE, GROUP A STREP (THRC)

## 2017-12-07 ENCOUNTER — Encounter: Payer: Self-pay | Admitting: Physician Assistant

## 2018-02-05 ENCOUNTER — Encounter (HOSPITAL_COMMUNITY): Payer: Self-pay | Admitting: Emergency Medicine

## 2018-02-05 ENCOUNTER — Emergency Department (HOSPITAL_COMMUNITY)
Admission: EM | Admit: 2018-02-05 | Discharge: 2018-02-05 | Disposition: A | Discharge status: Home / Self Care | Payer: Self-pay | Attending: Emergency Medicine | Admitting: Emergency Medicine

## 2018-02-05 ENCOUNTER — Emergency Department (HOSPITAL_COMMUNITY): Payer: Self-pay

## 2018-02-05 DIAGNOSIS — Z79899 Other long term (current) drug therapy: Secondary | ICD-10-CM | POA: Insufficient documentation

## 2018-02-05 DIAGNOSIS — Z87891 Personal history of nicotine dependence: Secondary | ICD-10-CM | POA: Insufficient documentation

## 2018-02-05 DIAGNOSIS — J45909 Unspecified asthma, uncomplicated: Secondary | ICD-10-CM | POA: Insufficient documentation

## 2018-02-05 DIAGNOSIS — R102 Pelvic and perineal pain: Secondary | ICD-10-CM | POA: Insufficient documentation

## 2018-02-05 DIAGNOSIS — N3090 Cystitis, unspecified without hematuria: Secondary | ICD-10-CM | POA: Insufficient documentation

## 2018-02-05 LAB — URINALYSIS, ROUTINE W REFLEX MICROSCOPIC
Bilirubin Urine: NEGATIVE
Glucose, UA: NEGATIVE mg/dL
KETONES UR: NEGATIVE mg/dL
Nitrite: NEGATIVE
PROTEIN: NEGATIVE mg/dL
Specific Gravity, Urine: 1.011 (ref 1.005–1.030)
pH: 5 (ref 5.0–8.0)

## 2018-02-05 LAB — BASIC METABOLIC PANEL
Anion gap: 9 (ref 5–15)
BUN: 9 mg/dL (ref 6–20)
CALCIUM: 8.8 mg/dL — AB (ref 8.9–10.3)
CHLORIDE: 109 mmol/L (ref 101–111)
CO2: 21 mmol/L — AB (ref 22–32)
CREATININE: 0.79 mg/dL (ref 0.44–1.00)
GFR calc Af Amer: >60 mL/min (ref >60)
GFR calc non Af Amer: >60 mL/min (ref >60)
Glucose, Bld: 87 mg/dL (ref 65–99)
Potassium: 3.5 mmol/L (ref 3.5–5.1)
Sodium: 139 mmol/L (ref 135–145)

## 2018-02-05 LAB — I-STAT BETA HCG BLOOD, ED (MC, WL, AP ONLY)

## 2018-02-05 LAB — CBC
HCT: 36 % (ref 36.0–46.0)
Hemoglobin: 12 g/dL (ref 12.0–15.0)
MCH: 31.9 pg (ref 26.0–34.0)
MCHC: 33.3 g/dL (ref 30.0–36.0)
MCV: 95.7 fL (ref 78.0–100.0)
Platelets: 208 10*3/uL (ref 150–400)
RBC: 3.76 MIL/uL — AB (ref 3.87–5.11)
RDW: 13.4 % (ref 11.5–15.5)
WBC: 7.2 10*3/uL (ref 4.0–10.5)

## 2018-02-05 LAB — I-STAT CG4 LACTIC ACID, ED
LACTIC ACID, VENOUS: 1.35 mmol/L (ref 0.5–1.9)
Lactic Acid, Venous: 0.8 mmol/L (ref 0.5–1.9)

## 2018-02-05 MED ORDER — SULFAMETHOXAZOLE-TRIMETHOPRIM 800-160 MG PO TABS: 1.0000 | ORAL_TABLET | Freq: Two times a day (BID) | ORAL | 0 refills | Status: AC

## 2018-02-05 MED ORDER — SODIUM CHLORIDE 0.9 % IV BOLUS
1000.0000 mL | Freq: Once | INTRAVENOUS | Status: DC
Start: 2018-02-05 — End: 2018-02-05

## 2018-02-05 MED ORDER — PROMETHAZINE HCL 25 MG PO TABS: 25.0000 mg | ORAL_TABLET | Freq: Four times a day (QID) | ORAL | 0 refills | Status: DC | PRN

## 2018-02-05 MED ORDER — KETOROLAC TROMETHAMINE 30 MG/ML IJ SOLN
30.0000 mg | Freq: Once | INTRAMUSCULAR | Status: AC
  Administered 2018-02-05: 30 mg via INTRAVENOUS
  Filled 2018-02-05: qty 1

## 2018-02-05 MED ORDER — ONDANSETRON HCL 4 MG/2ML IJ SOLN
4.0000 mg | Freq: Once | INTRAMUSCULAR | Status: AC
  Administered 2018-02-05: 4 mg via INTRAVENOUS
  Filled 2018-02-05: qty 2

## 2018-02-05 MED ORDER — PHENAZOPYRIDINE HCL 200 MG PO TABS: 200.0000 mg | ORAL_TABLET | Freq: Three times a day (TID) | ORAL | 0 refills | Status: DC

## 2018-02-05 MED ORDER — PHENAZOPYRIDINE HCL 100 MG PO TABS
200.0000 mg | ORAL_TABLET | Freq: Once | ORAL | Status: AC
  Administered 2018-02-05: 200 mg via ORAL
  Filled 2018-02-05: qty 2

## 2018-02-05 MED ORDER — SULFAMETHOXAZOLE-TRIMETHOPRIM 800-160 MG PO TABS
1.0000 | ORAL_TABLET | Freq: Once | ORAL | Status: AC
  Administered 2018-02-05: 1 via ORAL
  Filled 2018-02-05: qty 1

## 2018-02-05 NOTE — ED Triage Notes (Signed)
Pt reports hematuria,  Nausea, chills and sweating and some pelvic pain x2 days. States that she has pain when urinating. Pt a/ox4, resp e/u.

## 2018-02-05 NOTE — ED Provider Notes (Signed)
[facility] Provider Note   CSN: [id number] Arrival date & time: 02/05/18  [id number]     History   Chief Complaint Chief Complaint  Patient presents with  . Hematuria  . Nausea    HPI Brianna Garrett is a 32 y.o. female.  Pt presents to the ED today with blood in urine and nausea.  The pt also c/o pelvic pain.  The pt is concerned she has an uti.     Past Medical History:  Diagnosis Date  . Allergy   . Asthma     There are no active problems to display for this patient.   Past Surgical History:  Procedure Laterality Date  . HIP SURGERY       OB History   None      Home Medications    Prior to Admission medications   Medication Sig Start Date End Date Taking? Authorizing Provider  albuterol (PROVENTIL HFA;VENTOLIN HFA) 108 (90 Base) MCG/ACT inhaler Inhale 2 puffs into the lungs every 6 (six) hours as needed for wheezing or shortness of breath. 09/12/16   Linna HoffKindl, James D, MD  azithromycin (ZITHROMAX) 250 MG tablet Take 2 tabs PO x 1 dose, then 1 tab PO QD x 4 days 02/02/17   English, Stephanie D, PA  Guaifenesin (MUCINEX MAXIMUM STRENGTH) 1200 MG TB12 Take 1 tablet (1,200 mg total) by mouth every 12 (twelve) hours as needed. 02/02/17   Trena PlattEnglish, Stephanie D, PA  guaiFENesin-codeine (ROBITUSSIN AC) 100-10 MG/5ML syrup Take 10 mLs by mouth 4 (four) times daily as needed for cough. 09/12/16   Linna HoffKindl, James D, MD  ipratropium (ATROVENT) 0.06 % nasal spray Place 2 sprays into both nostrils 4 (four) times daily. 09/12/16   Linna HoffKindl, James D, MD  metroNIDAZOLE (FLAGYL) 500 MG tablet Take 1 tablet (500 mg total) by mouth 2 (two) times daily. 06/15/16   Neva SeatGreene, Tiffany, PA-C  montelukast (SINGULAIR) 10 MG tablet Take 1 tablet (10 mg total) by mouth at bedtime. 02/02/17   Trena PlattEnglish, Stephanie D, PA  phenazopyridine (PYRIDIUM) 200 MG tablet Take 1 tablet (200 mg total) by mouth 3 (three) times daily. 02/05/18   Jacalyn LefevreHaviland, Legacy Carrender, MD  predniSONE (DELTASONE) 20  MG tablet Take 3 PO QAM x2days, 2 PO QAM x2days, 1 PO QAM x3days 02/02/17   Trena PlattEnglish, Stephanie D, PA  promethazine (PHENERGAN) 25 MG tablet Take 1 tablet (25 mg total) by mouth every 6 (six) hours as needed for nausea or vomiting. 02/05/18   Jacalyn LefevreHaviland, Cheryll Keisler, MD  sulfamethoxazole-trimethoprim (BACTRIM DS,SEPTRA DS) 800-160 MG tablet Take 1 tablet by mouth 2 (two) times daily for 7 days. 02/05/18 02/12/18  Jacalyn LefevreHaviland, Spence Soberano, MD  traMADol (ULTRAM) 50 MG tablet Take 1 tablet (50 mg total) by mouth every 6 (six) hours as needed. 06/15/16   Marlon PelGreene, Tiffany, PA-C    Family History No family history on file.  Social History Social History   Tobacco Use  . Smoking status: Former Smoker    Last attempt to quit: 01/23/2017    Years since quitting: 1.0  . Smokeless tobacco: Never Used  Substance Use Topics  . Alcohol use: Yes    Alcohol/week: 0.0 oz  . Drug use: No     Allergies   Claritin [loratadine]; Flonase [fluticasone propionate]; and Pineapple   Review of Systems Review of Systems  Genitourinary: Positive for dysuria and hematuria.  All other systems reviewed and are negative.    Physical Exam Updated Vital Signs BP 123/87 (BP Location: Right  Arm)   Pulse 74   Temp 98 F (36.7 C) (Oral)   Resp 18   LMP 02/02/2018 (Exact Date)   SpO2 99%   Physical Exam  Constitutional: She is oriented to person, place, and time. She appears well-developed and well-nourished.  HENT:  Head: Normocephalic and atraumatic.  Right Ear: External ear normal.  Left Ear: External ear normal.  Nose: Nose normal.  Mouth/Throat: Oropharynx is clear and moist.  Eyes: Pupils are equal, round, and reactive to light. Conjunctivae and EOM are normal.  Neck: Normal range of motion. Neck supple.  Cardiovascular: Normal rate, regular rhythm, normal heart sounds and intact distal pulses.  Pulmonary/Chest: Effort normal and breath sounds normal.  Abdominal: Soft. Bowel sounds are normal.  Musculoskeletal: Normal  range of motion.  Neurological: She is alert and oriented to person, place, and time.  Skin: Skin is warm. Capillary refill takes less than 2 seconds.  Psychiatric: She has a normal mood and affect. Her behavior is normal. Judgment and thought content normal.  Nursing note and vitals reviewed.    ED Treatments / Results  Labs (all labs ordered are listed, but only abnormal results are displayed) Labs Reviewed  URINALYSIS, ROUTINE W REFLEX MICROSCOPIC - Abnormal; Notable for the following components:      Result Value   Hgb urine dipstick MODERATE (*)    Leukocytes, UA MODERATE (*)    WBC, UA >50 (*)    Bacteria, UA FEW (*)    All other components within normal limits  BASIC METABOLIC PANEL - Abnormal; Notable for the following components:   CO2 21 (*)    Calcium 8.8 (*)    All other components within normal limits  CBC - Abnormal; Notable for the following components:   RBC 3.76 (*)    All other components within normal limits  URINE CULTURE  I-STAT BETA HCG BLOOD, ED ([facility], [facility], [facility] ONLY)  I-STAT CG4 LACTIC ACID, ED  I-STAT CG4 LACTIC ACID, ED    EKG None  Radiology Ct Renal Stone Study  Result Date: 02/05/2018 CLINICAL DATA:  Nausea, chills and sweating with some pelvic pain for 2 days. Reports hematuria. EXAM: CT ABDOMEN AND PELVIS WITHOUT CONTRAST TECHNIQUE: Multidetector CT imaging of the abdomen and pelvis was performed following the standard protocol without IV contrast. COMPARISON:  None. FINDINGS: Lower chest: Clear lung bases.  Heart normal in size. Hepatobiliary: There shows mild decreased attenuation diffusely consistent with fatty infiltration. No liver mass or focal lesion. Normal gallbladder. No bile duct dilation. Pancreas: Unremarkable. No pancreatic ductal dilatation or surrounding inflammatory changes. Spleen: Normal in size without focal abnormality. Adrenals/Urinary Tract: No adrenal masses. Kidneys normal size, orientation and position. No renal masses or  stones. No hydronephrosis. Normal ureters. Bladder is mildly distended. Wall appears mildly thickened. No mass or stone. Stomach/Bowel: Stomach, small bowel and colon are unremarkable. Appendix not visualized. No evidence of appendicitis. Vascular/Lymphatic: No significant vascular findings are present. No enlarged abdominal or pelvic lymph nodes. Reproductive: Uterus and adnexa are unremarkable. Trace, physiologic, pelvic free fluid. Other: No abdominal wall hernia. Musculoskeletal: No fracture or acute finding. There arthropathic changes of both hips. Two screws have been inserted into the left proximal femur crossing the femoral neck into the femoral head, well-seated. IMPRESSION: 1. No evidence of renal or ureteral stones. No obstructive uropathy. 2. Mild wall thickening of the bladder.  Suspect cystitis. 3. No other evidence of an acute abnormality within the abdomen or pelvis. Electronically Signed   By: Onalee Hua  Ormond M.D.   On: 02/05/2018 11:03    Procedures Procedures (including critical care time)  Medications Ordered in ED Medications  ketorolac (TORADOL) 30 MG/ML injection 30 mg (has no administration in time range)  ondansetron (ZOFRAN) injection 4 mg (has no administration in time range)  sodium chloride 0.9 % bolus 1,000 mL (has no administration in time range)  sulfamethoxazole-trimethoprim (BACTRIM DS,SEPTRA DS) 800-160 MG per tablet 1 tablet (has no administration in time range)  phenazopyridine (PYRIDIUM) tablet 200 mg (has no administration in time range)     Initial Impression / Assessment and Plan / ED Course  I have reviewed the triage vital signs and the nursing notes.  Pertinent labs & imaging results that were available during my care of the patient were reviewed by me and considered in my medical decision making (see chart for details).     Urine not really c/w UTI, so I did a ct renal to check for stones.  The ct read cystitis and pt is symptomatic, so she will be  treated for an UTI.  Urine sent for cx.  Pt to be given first dose of bactrim and pyridium prior to d/c.  She knows to return if worse.  Final Clinical Impressions(s) / ED Diagnoses   Final diagnoses:  Cystitis    ED Discharge Orders        Ordered    sulfamethoxazole-trimethoprim (BACTRIM DS,SEPTRA DS) 800-160 MG tablet  2 times daily     02/05/18 1116    phenazopyridine (PYRIDIUM) 200 MG tablet  3 times daily     02/05/18 1116    promethazine (PHENERGAN) 25 MG tablet  Every 6 hours PRN     02/05/18 1116       Jacalyn Lefevre, MD 02/05/18 1129

## 2018-02-06 LAB — URINE CULTURE: Culture: <10000 — AB

## 2018-04-29 ENCOUNTER — Other Ambulatory Visit: Payer: Self-pay

## 2018-04-29 ENCOUNTER — Emergency Department (HOSPITAL_COMMUNITY)
Admission: EM | Admit: 2018-04-29 | Discharge: 2018-04-30 | Disposition: A | Discharge status: Home / Self Care | Payer: Self-pay | Attending: Emergency Medicine | Admitting: Emergency Medicine

## 2018-04-29 ENCOUNTER — Encounter (HOSPITAL_COMMUNITY): Payer: Self-pay | Admitting: Emergency Medicine

## 2018-04-29 DIAGNOSIS — Z79899 Other long term (current) drug therapy: Secondary | ICD-10-CM | POA: Insufficient documentation

## 2018-04-29 DIAGNOSIS — R110 Nausea: Secondary | ICD-10-CM | POA: Insufficient documentation

## 2018-04-29 DIAGNOSIS — R61 Generalized hyperhidrosis: Secondary | ICD-10-CM | POA: Insufficient documentation

## 2018-04-29 DIAGNOSIS — J45909 Unspecified asthma, uncomplicated: Secondary | ICD-10-CM | POA: Insufficient documentation

## 2018-04-29 DIAGNOSIS — R55 Syncope and collapse: Secondary | ICD-10-CM | POA: Insufficient documentation

## 2018-04-29 LAB — CBC
HCT: 39.3 % (ref 36.0–46.0)
HEMOGLOBIN: 12.8 g/dL (ref 12.0–15.0)
MCH: 31.6 pg (ref 26.0–34.0)
MCHC: 32.6 g/dL (ref 30.0–36.0)
MCV: 97 fL (ref 78.0–100.0)
Platelets: 207 10*3/uL (ref 150–400)
RBC: 4.05 MIL/uL (ref 3.87–5.11)
RDW: 13.7 % (ref 11.5–15.5)
WBC: 9.5 10*3/uL (ref 4.0–10.5)

## 2018-04-29 LAB — URINALYSIS, ROUTINE W REFLEX MICROSCOPIC
Bilirubin Urine: NEGATIVE
GLUCOSE, UA: NEGATIVE mg/dL
Ketones, ur: 80 mg/dL — AB
Leukocytes, UA: NEGATIVE
NITRITE: NEGATIVE
PH: 5 (ref 5.0–8.0)
PROTEIN: NEGATIVE mg/dL
Specific Gravity, Urine: 1.02 (ref 1.005–1.030)

## 2018-04-29 LAB — BASIC METABOLIC PANEL
ANION GAP: 14 (ref 5–15)
BUN: 11 mg/dL (ref 6–20)
CALCIUM: 9 mg/dL (ref 8.9–10.3)
CO2: 19 mmol/L — ABNORMAL LOW (ref 22–32)
Chloride: 103 mmol/L (ref 98–111)
Creatinine, Ser: 0.77 mg/dL (ref 0.44–1.00)
GFR calc non Af Amer: >60 mL/min (ref >60)
GLUCOSE: 86 mg/dL (ref 70–99)
Potassium: 3.5 mmol/L (ref 3.5–5.1)
Sodium: 136 mmol/L (ref 135–145)

## 2018-04-29 LAB — CBG MONITORING, ED: Glucose-Capillary: 153 mg/dL — ABNORMAL HIGH (ref 70–99)

## 2018-04-29 LAB — I-STAT BETA HCG BLOOD, ED (MC, WL, AP ONLY): I-stat hCG, quantitative: <5 m[IU]/mL (ref <5)

## 2018-04-29 NOTE — ED Triage Notes (Signed)
Pt reports fatigue and dizziness that started at 1200 today. Pt reports a syncopal episode at work while sitting. Denies any injuries or hitting head, denies blood thinners. + LOC with parathesia in fingers and blurry vision. Denies pain.

## 2018-04-30 NOTE — Discharge Instructions (Addendum)
Your lab work and EKG are all normal today. I have no clear explanation why you passed out today. There are no emergent cardiac or neurological reasons for this.  It is important for you to establish care with a PCP who you can follow-up with about this. I have placed a couple of practices below, but there is also a number at the end of these papers were you can call and find someone as well.  Be sure to stay hydrated and eat regularly.  Have a good weekend!

## 2018-04-30 NOTE — ED Notes (Signed)
Reviewed d/c instructions with pt, who verbalized understanding and had no outstanding questions. Pt departed in NAD, refused use of wheelchair.   

## 2018-04-30 NOTE — ED Provider Notes (Signed)
[facility] Provider Note  CSN: [id number] Arrival date & time: 04/29/18  1735    History   Chief Complaint Chief Complaint  Patient presents with  . Loss of Consciousness    HPI Brianna Garrett is a 32 y.o. female with a medical history of asthma and allergies who presented to the ED for syncopal event. She describes preceding symptoms of diaphoresis, nausea, dizziness and lightheadedness while at work. She reports LOC, but is unsure for how long. Patient states she was in a seated position when this occurred. This was witnessed by patient's coworkers who do not report any unusual movements, bowel/bladder incontinence or other abnormalities during the LOC period. Denies AMS/confusion, unusual behavior, slurred speech, facial droop, vision changes, chest pain, palpitations, SOB or headache prior to or after this episode. Patient reports not eating this morning before work. She denies past LOC or near syncopal events. Prior to today, patient denies any health concerns or changes and states that she is generally healthy.  Past Medical History:  Diagnosis Date  . Allergy   . Asthma     There are no active problems to display for this patient.   Past Surgical History:  Procedure Laterality Date  . HIP SURGERY       OB History   None      Home Medications    Prior to Admission medications   Medication Sig Start Date End Date Taking? Authorizing Provider  ibuprofen (ADVIL,MOTRIN) 200 MG tablet Take 800 mg by mouth every 6 (six) hours as needed for mild pain.   Yes [provider]  albuterol (PROVENTIL HFA;VENTOLIN HFA) 108 (90 Base) MCG/ACT inhaler Inhale 2 puffs into the lungs every 6 (six) hours as needed for wheezing or shortness of breath. Patient not taking: Reported on 04/29/2018 09/12/16   Linna HoffKindl, James D, MD  azithromycin (ZITHROMAX) 250 MG tablet Take 2 tabs PO x 1 dose, then 1 tab PO QD x 4 days Patient not taking:  Reported on 04/29/2018 02/02/17   Trena PlattEnglish, Stephanie D, PA  Guaifenesin [facility](MUCINEX MAXIMUM STRENGTH) 1200 MG TB12 Take 1 tablet (1,200 mg total) by mouth every 12 (twelve) hours as needed. Patient not taking: Reported on 04/29/2018 02/02/17   Trena PlattEnglish, Stephanie D, PA  guaiFENesin-codeine [facility](ROBITUSSIN AC) 100-10 MG/5ML syrup Take 10 mLs by mouth 4 (four) times daily as needed for cough. Patient not taking: Reported on 04/29/2018 09/12/16   Linna HoffKindl, James D, MD  ipratropium (ATROVENT) 0.06 % nasal spray Place 2 sprays into both nostrils 4 (four) times daily. Patient not taking: Reported on 04/29/2018 09/12/16   Linna HoffKindl, James D, MD  metroNIDAZOLE (FLAGYL) 500 MG tablet Take 1 tablet (500 mg total) by mouth 2 (two) times daily. Patient not taking: Reported on 04/29/2018 06/15/16   Marlon PelGreene, Tiffany, PA-C  montelukast (SINGULAIR) 10 MG tablet Take 1 tablet (10 mg total) by mouth at bedtime. Patient not taking: Reported on 04/29/2018 02/02/17   Trena PlattEnglish, Stephanie D, PA  phenazopyridine (PYRIDIUM) 200 MG tablet Take 1 tablet (200 mg total) by mouth 3 (three) times daily. Patient not taking: Reported on 04/29/2018 02/05/18   Jacalyn LefevreHaviland, Julie, MD  predniSONE (DELTASONE) 20 MG tablet Take 3 PO QAM x2days, 2 PO QAM x2days, 1 PO QAM x3days Patient not taking: Reported on 04/29/2018 02/02/17   Trena PlattEnglish, Stephanie D, PA  promethazine (PHENERGAN) 25 MG tablet Take 1 tablet (25 mg total) by mouth every 6 (six) hours as needed for nausea or vomiting. Patient not taking:  Reported on 04/29/2018 02/05/18   Jacalyn Lefevre, MD  traMADol (ULTRAM) 50 MG tablet Take 1 tablet (50 mg total) by mouth every 6 (six) hours as needed. Patient not taking: Reported on 04/29/2018 06/15/16   Marlon Pel, PA-C    Family History No family history on file.  Social History Social History   Tobacco Use  . Smoking status: Former Smoker    Last attempt to quit: 01/23/2017    Years since quitting: 1.2  . Smokeless tobacco: Never Used  Substance Use Topics    . Alcohol use: Yes    Alcohol/week: 0.0 standard drinks    Comment: occ  . Drug use: No     Allergies   Pineapple; Claritin [loratadine]; and Flonase [fluticasone propionate]   Review of Systems Review of Systems  Constitutional: Positive for diaphoresis. Negative for activity change, appetite change, chills, fatigue and fever.  HENT: Negative.   Eyes: Negative for visual disturbance.  Respiratory: Negative for cough, chest tightness and shortness of breath.   Cardiovascular: Negative for chest pain, palpitations and leg swelling.  Gastrointestinal: Positive for nausea. Negative for vomiting.  Genitourinary: Negative.   Musculoskeletal: Negative.   Skin: Negative.   Neurological: Positive for dizziness, syncope and light-headedness. Negative for seizures, speech difficulty, weakness, numbness and headaches.  Hematological: Negative.   Psychiatric/Behavioral: Negative for behavioral problems, confusion and decreased concentration.     Physical Exam Updated Vital Signs BP 134/86 (BP Location: Left Arm)   Pulse 79   Temp 98 F (36.7 C) (Oral)   Resp 15   Ht 5\' 10"  (1.778 m)   Wt 83.9 kg   LMP 04/01/2018   SpO2 100%   BMI 26.54 kg/m   Physical Exam  Constitutional: She is oriented to person, place, and time. She appears well-developed and well-nourished. No distress.  HENT:  Head: Normocephalic and atraumatic.  Eyes: Pupils are equal, round, and reactive to light. Conjunctivae and EOM are normal.  Neck: Normal range of motion. Neck supple.  Cardiovascular: Normal rate, regular rhythm, normal heart sounds and intact distal pulses.  Pulmonary/Chest: Effort normal and breath sounds normal.  Abdominal: Soft. Bowel sounds are normal. There is no tenderness.  Neurological: She is alert and oriented to person, place, and time. She has normal strength. No cranial nerve deficit or sensory deficit. She exhibits normal muscle tone. She displays a negative Romberg sign.  Coordination and gait normal. GCS eye subscore is 4. GCS verbal subscore is 5. GCS motor subscore is 6.  Reflex Scores:      Tricep reflexes are 2+ on the right side and 2+ on the left side.      Bicep reflexes are 2+ on the right side and 2+ on the left side.      Brachioradialis reflexes are 2+ on the right side and 2+ on the left side.      Patellar reflexes are 2+ on the right side and 2+ on the left side.      Achilles reflexes are 2+ on the right side and 2+ on the left side. Skin: Skin is warm. She is not diaphoretic.  Psychiatric: She has a normal mood and affect. Her speech is normal and behavior is normal. Thought content normal. Cognition and memory are normal.  Nursing note and vitals reviewed.    ED Treatments / Results  Labs (all labs ordered are listed, but only abnormal results are displayed) Labs Reviewed  BASIC METABOLIC PANEL - Abnormal; Notable for the following components:  Result Value   CO2 19 (*)    All other components within normal limits  URINALYSIS, ROUTINE W REFLEX MICROSCOPIC - Abnormal; Notable for the following components:   Hgb urine dipstick SMALL (*)    Ketones, ur 80 (*)    Bacteria, UA RARE (*)    All other components within normal limits  CBG MONITORING, ED - Abnormal; Notable for the following components:   Glucose-Capillary 153 (*)    All other components within normal limits  CBC  I-STAT BETA HCG BLOOD, ED ([facility], [facility], [facility] ONLY)    EKG EKG Interpretation  Date/Time:  Friday April 29 2018 18:13:26 EDT Ventricular Rate:  83 PR Interval:  130 QRS Duration: 88 QT Interval:  408 QTC Calculation: 479 R Axis:   82 Text Interpretation:  Sinus rhythm with marked sinus arrhythmia Nonspecific T wave abnormality Prolonged QT Abnormal ECG When compared with ECG of 08/26/2011, QT has lengthened Nonspecific T wave abnormality is now present Confirmed by Dione Booze ([id number]) on 04/29/2018 11:35:55 PM   Radiology No results  found.  Procedures Procedures (including critical care time)  Medications Ordered in ED Medications - No data to display   Initial Impression / Assessment and Plan / ED Course  Triage vital signs and the nursing notes have been reviewed.  Pertinent labs & imaging results that were available during care of the patient were reviewed and considered in medical decision making (see chart for details).  Patient presents following a syncopal event. Event preceded by lightheadedness, dizziness and nausea. Patient denies chest pain, SOB, palpitations or other cardiac or pulmonary symptoms. Event was witnessed by coworkers. No seizure-like activity was observed. No post-ictal state. Patient's physical exam is unremarkable. No focal neuro deficits on exam. Denies recent head trauma or trauma following syncope that would warrant head imaging. Evaluation of acute cardiac, infectious or metabolic etiologies have been ruled out as well.   Clinical Course as of Apr 30 316  Sat Apr 30, 2018  0000 Normal vital signs with lying, sitting and standing. Labs unremarkable.   [GM]    Clinical Course User Index [GM] Angelo Prindle, Sharyon Medicus, PA-C    Final Clinical Impressions(s) / ED Diagnoses  1. Syncope. Unclear etiology. Advised to establish care with PCP. Education provided on s/s that would warrant return to ED.  Dispo: Home. After thorough clinical evaluation, this patient is determined to be medically stable and can be safely discharged with the previously mentioned treatment and/or outpatient follow-up/referral(s). At this time, there are no other apparent medical conditions that require further screening, evaluation or treatment.   Final diagnoses:  Syncope, unspecified syncope type    ED Discharge Orders    None        Windy Carina, [place] 04/30/18 6045    Vanetta Mulders, MD 05/08/18 [phone]

## 2018-08-09 IMAGING — CT CT RENAL STONE PROTOCOL
2 of 4 series · 15 of 46 positions shown, 17 images · non-contrast
Comparison: None.

CLINICAL DATA: Nausea, chills and sweating with some pelvic pain
for 2 days. Reports hematuria.

EXAM:
CT ABDOMEN AND PELVIS WITHOUT CONTRAST
TECHNIQUE: Multidetector CT imaging of the abdomen and pelvis was performed
following the standard protocol without IV contrast.

[Series 3: renal stone 5.0 · axial · 0.66mm/px · z∈[+817,+1227]mm · 12 of 94 slices shown, 14 images]
[im 8/94  soft-tissue]
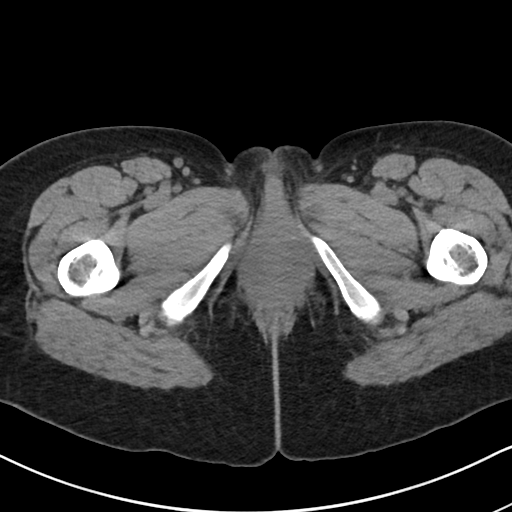
[im 8/94  bone]
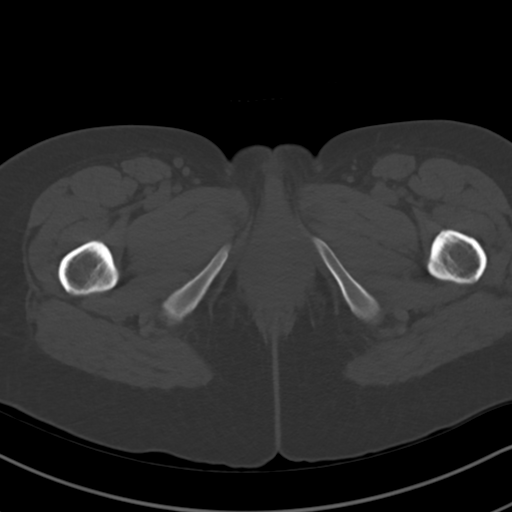
[im 15/94  soft-tissue]
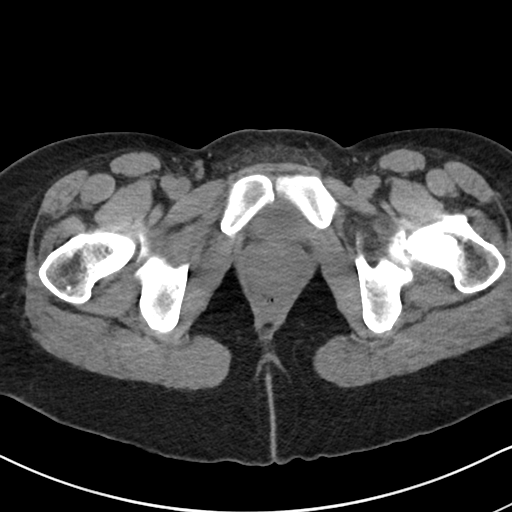
[im 23/94  soft-tissue]
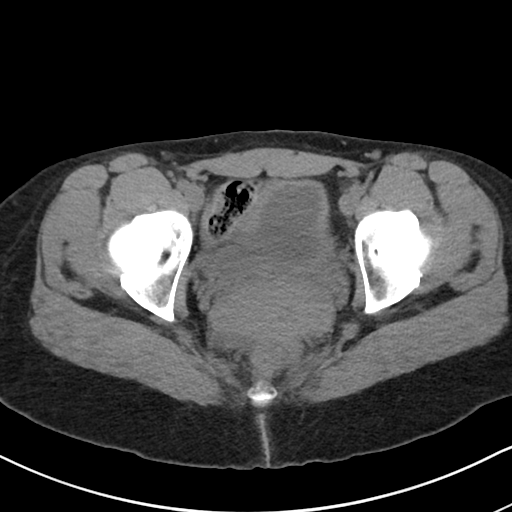
[im 30/94  soft-tissue]
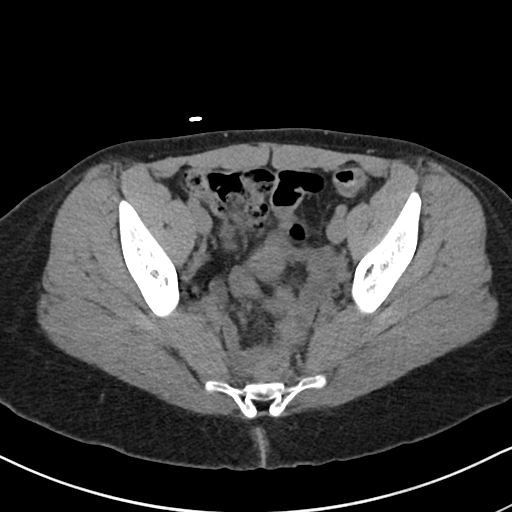
[im 38/94  soft-tissue]
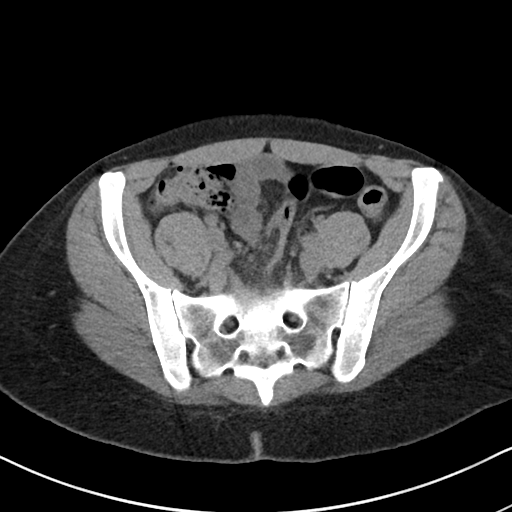
[im 45/94  soft-tissue]
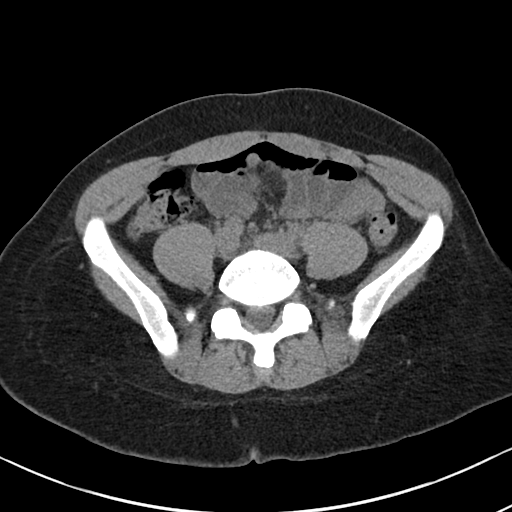
[im 53/94  soft-tissue]
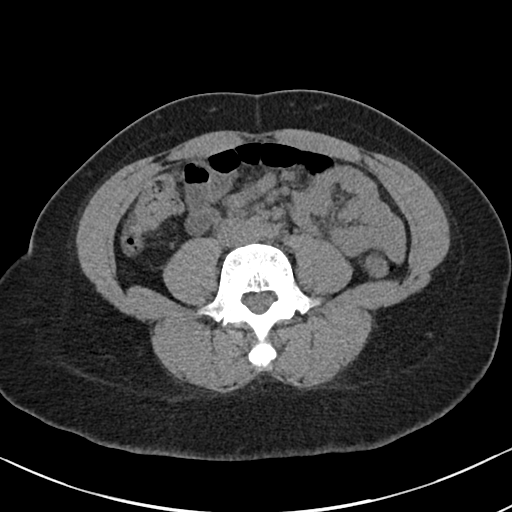
[im 60/94  soft-tissue]
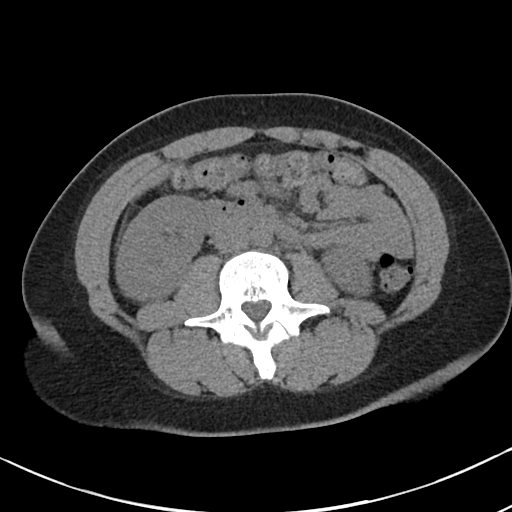
[im 67/94  soft-tissue]
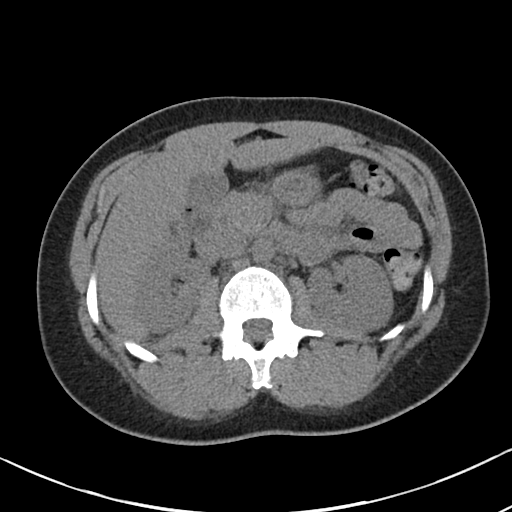
[im 67/94  bone]
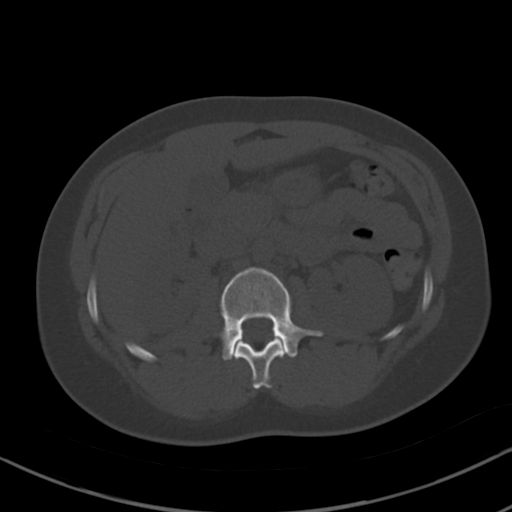
[im 75/94  soft-tissue]
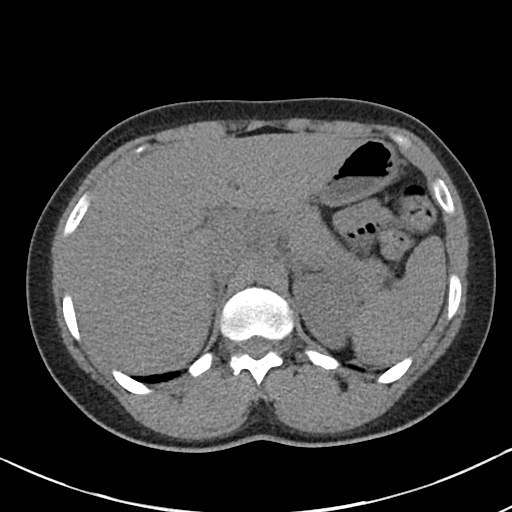
[im 82/94  soft-tissue]
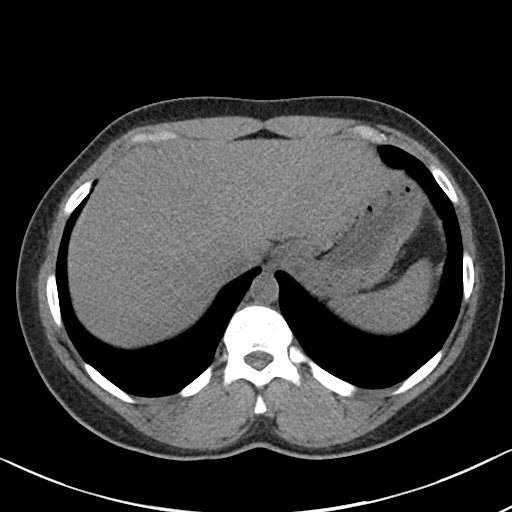
[im 90/94  soft-tissue]
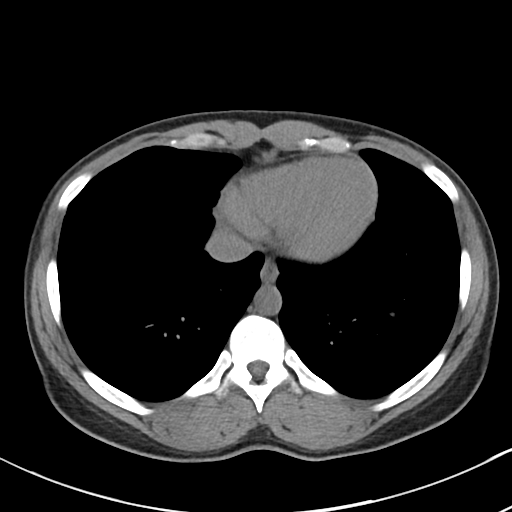

[Series 5: renal stone 3.0 cor · coronal · 0.59mm/px · 3 of 101 slices shown]
[im 34/101  soft-tissue]
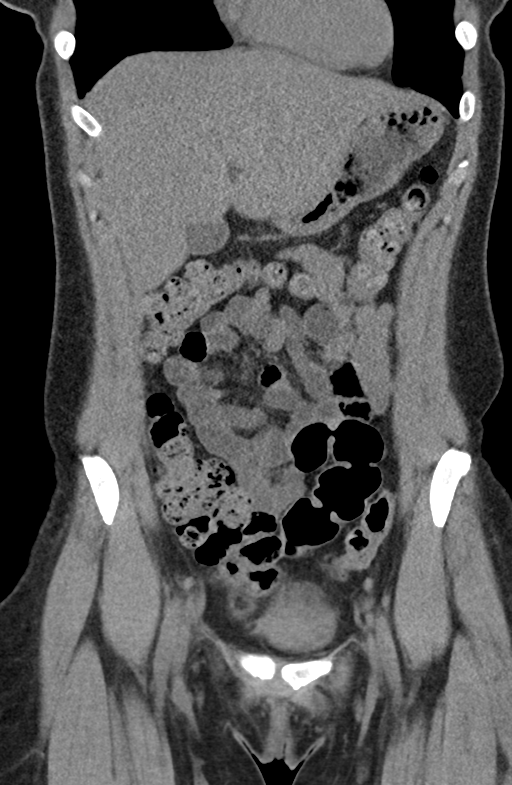
[im 45/101  soft-tissue]
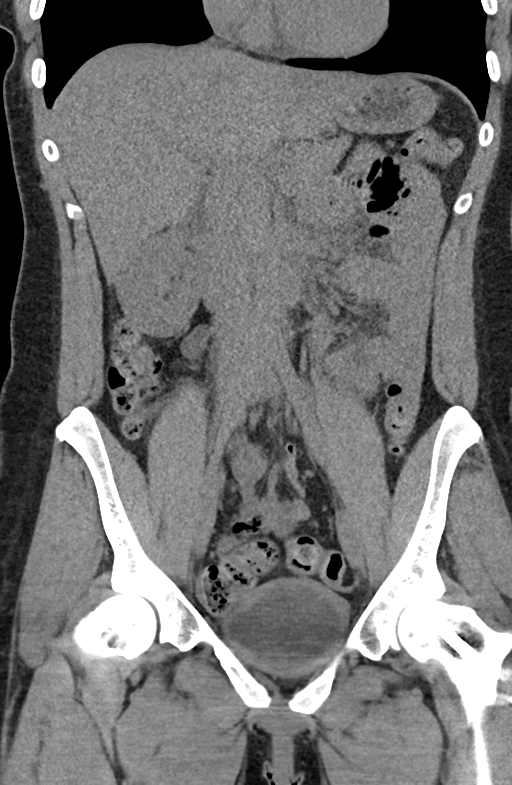
[im 56/101  soft-tissue]
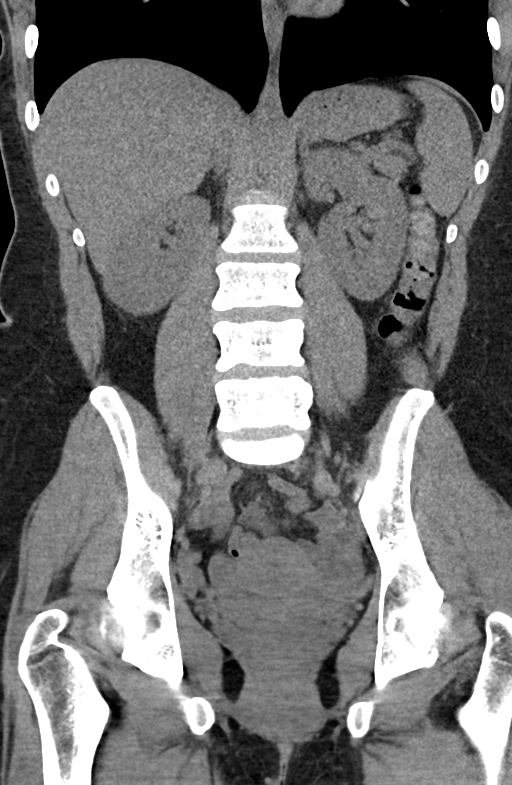

[15 of 46 positions shown; findings below may reference images not displayed]

FINDINGS: Lower chest: Clear lung bases.  Heart normal in size.

Hepatobiliary: There shows mild decreased attenuation diffusely
consistent with fatty infiltration. No liver mass or focal lesion.
Normal gallbladder. No bile duct dilation.

Pancreas: Unremarkable. No pancreatic ductal dilatation or
surrounding inflammatory changes.

Spleen: Normal in size without focal abnormality.

Adrenals/Urinary Tract: No adrenal masses.

Kidneys normal size, orientation and position. No renal masses or
stones. No hydronephrosis.

Normal ureters.

Bladder is mildly distended. Wall appears mildly thickened. No mass
or stone.

Stomach/Bowel: Stomach, small bowel and colon are unremarkable.
Appendix not visualized. No evidence of appendicitis.

Vascular/Lymphatic: No significant vascular findings are present. No
enlarged abdominal or pelvic lymph nodes.

Reproductive: Uterus and adnexa are unremarkable. Trace,
physiologic, pelvic free fluid.

Other: No abdominal wall hernia.

Musculoskeletal: No fracture or acute finding. There arthropathic
changes of both hips. Two screws have been inserted into the left
proximal femur crossing the femoral neck into the femoral head,
well-seated.
IMPRESSION: 1. No evidence of renal or ureteral stones. No obstructive uropathy.
2. Mild wall thickening of the bladder.  Suspect cystitis.
3. No other evidence of an acute abnormality within the abdomen or
pelvis.

## 2018-09-13 ENCOUNTER — Ambulatory Visit
Admission: EM | Admit: 2018-09-13 | Discharge: 2018-09-13 | Disposition: A | Discharge status: Home / Self Care | Payer: Self-pay | Attending: Physician Assistant | Admitting: Physician Assistant

## 2018-09-13 ENCOUNTER — Encounter: Payer: Self-pay | Admitting: Emergency Medicine

## 2018-09-13 DIAGNOSIS — J039 Acute tonsillitis, unspecified: Secondary | ICD-10-CM | POA: Insufficient documentation

## 2018-09-13 LAB — POCT RAPID STREP A (OFFICE): Rapid Strep A Screen: NEGATIVE

## 2018-09-13 MED ORDER — IPRATROPIUM BROMIDE 0.06 % NA SOLN: 2.0000 | Freq: Four times a day (QID) | NASAL | 12 refills | Status: DC

## 2018-09-13 MED ORDER — AMOXICILLIN 500 MG PO CAPS: 500.0000 mg | ORAL_CAPSULE | Freq: Two times a day (BID) | ORAL | 0 refills | Status: AC

## 2018-09-13 NOTE — ED Provider Notes (Signed)
[facility]    CSN: [id number] Arrival date & time: 09/13/18  1046     History   Chief Complaint Chief Complaint  Patient presents with  . Sore Throat    HPI Brianna Garrett is a 33 y.o. female.   33 year old female comes in for 3 to 4-day history of sore throat, body aches, subjective fever.  Feels slightly congested without obvious rhinorrhea, cough.  Painful swallowing without trouble breathing, swelling of the throat, tripoding, drooling.  Multiple sick contacts.  Has not taken anything for the symptoms.  History of mono.     Past Medical History:  Diagnosis Date  . Allergy   . Asthma     There are no active problems to display for this patient.   Past Surgical History:  Procedure Laterality Date  . HIP SURGERY      OB History   No obstetric history on file.      Home Medications    Prior to Admission medications   Medication Sig Start Date End Date Taking? Authorizing Provider  albuterol (PROVENTIL HFA;VENTOLIN HFA) 108 (90 Base) MCG/ACT inhaler Inhale 2 puffs into the lungs every 6 (six) hours as needed for wheezing or shortness of breath. Patient not taking: Reported on 04/29/2018 09/12/16   Linna HoffKindl, James D, MD  amoxicillin (AMOXIL) 500 MG capsule Take 1 capsule (500 mg total) by mouth 2 (two) times daily for 10 days. 09/13/18 09/23/18  Belinda FisherYu, Rosela Supak V, PA-C  Guaifenesin (MUCINEX MAXIMUM STRENGTH) 1200 MG TB12 Take 1 tablet (1,200 mg total) by mouth every 12 (twelve) hours as needed. Patient not taking: Reported on 04/29/2018 02/02/17   Trena PlattEnglish, Stephanie D, PA  guaiFENesin-codeine [facility](ROBITUSSIN AC) 100-10 MG/5ML syrup Take 10 mLs by mouth 4 (four) times daily as needed for cough. Patient not taking: Reported on 04/29/2018 09/12/16   Linna HoffKindl, James D, MD  ibuprofen (ADVIL,MOTRIN) 200 MG tablet Take 800 mg by mouth every 6 (six) hours as needed for mild pain.    [provider]  ipratropium (ATROVENT) 0.06 % nasal spray Place 2 sprays into both nostrils  4 (four) times daily. 09/13/18   Cathie HoopsYu, Eastyn Dattilo V, PA-C  montelukast (SINGULAIR) 10 MG tablet Take 1 tablet (10 mg total) by mouth at bedtime. Patient not taking: Reported on 04/29/2018 02/02/17   Trena PlattEnglish, Stephanie D, PA  promethazine (PHENERGAN) 25 MG tablet Take 1 tablet (25 mg total) by mouth every 6 (six) hours as needed for nausea or vomiting. Patient not taking: Reported on 04/29/2018 02/05/18   Jacalyn LefevreHaviland, Julie, MD    Family History History reviewed. No pertinent family history.  Social History Social History   Tobacco Use  . Smoking status: Former Smoker    Last attempt to quit: 01/23/2017    Years since quitting: 1.6  . Smokeless tobacco: Never Used  Substance Use Topics  . Alcohol use: Yes    Alcohol/week: 0.0 standard drinks    Comment: occ  . Drug use: No     Allergies   Pineapple; Claritin [loratadine]; and Flonase [fluticasone propionate]   Review of Systems Review of Systems  Reason unable to perform ROS: See HPI as above.     Physical Exam Triage Vital Signs ED Triage Vitals [09/13/18 1101]  Enc Vitals Group     BP (!) 136/96     Pulse Rate 90     Resp 20     Temp 97.7 F (36.5 C)     Temp Source Oral     SpO2  97 %     Weight      Height      Head Circumference      Peak Flow      Pain Score 8     Pain Loc      Pain Edu?      Excl. in GC?    No data found.  Updated Vital Signs BP (!) 136/96 (BP Location: Left Arm)   Pulse 90   Temp 97.7 F (36.5 C) (Oral)   Resp 20   SpO2 97%   Physical Exam Constitutional:      General: She is not in acute distress.    Appearance: She is well-developed. She is not ill-appearing, toxic-appearing or diaphoretic.  HENT:     Head: Normocephalic and atraumatic.     Right Ear: Tympanic membrane, ear canal and external ear normal. Tympanic membrane is not erythematous or bulging.     Left Ear: Tympanic membrane, ear canal and external ear normal. Tympanic membrane is not erythematous or bulging.     Nose: Nose  normal.     Right Sinus: No maxillary sinus tenderness or frontal sinus tenderness.     Left Sinus: No maxillary sinus tenderness or frontal sinus tenderness.     Mouth/Throat:     Mouth: Mucous membranes are moist.     Pharynx: Oropharynx is clear. Uvula midline. No uvula swelling.     Tonsils: Tonsillar exudate present. Swelling: 2+ on the right. 2+ on the left.  Eyes:     Conjunctiva/sclera: Conjunctivae normal.     Pupils: Pupils are equal, round, and reactive to light.  Neck:     Musculoskeletal: Normal range of motion and neck supple.  Cardiovascular:     Rate and Rhythm: Normal rate and regular rhythm.     Heart sounds: Normal heart sounds. No murmur. No friction rub. No gallop.   Pulmonary:     Effort: Pulmonary effort is normal.     Breath sounds: Normal breath sounds. No decreased breath sounds, wheezing, rhonchi or rales.  Lymphadenopathy:     Cervical: No cervical adenopathy.  Skin:    General: Skin is warm and dry.  Neurological:     Mental Status: She is alert and oriented to person, place, and time.  Psychiatric:        Behavior: Behavior normal.        Judgment: Judgment normal.      UC Treatments / Results  Labs (all labs ordered are listed, but only abnormal results are displayed) Labs Reviewed  CULTURE, GROUP A STREP [facility])  POCT RAPID STREP A (OFFICE)    EKG None  Radiology No results found.  Procedures Procedures (including critical care time)  Medications Ordered in UC Medications - No data to display  Initial Impression / Assessment and Plan / UC Course  I have reviewed the triage vital signs and the nursing notes.  Pertinent labs & imaging results that were available during my care of the patient were reviewed by me and considered in my medical decision making (see chart for details).    Rapid strep negative.  However, given history and exam, will cover for tonsillitis with amoxicillin.  Other symptomatic treatment discussed.  Return  precautions given.  Patient expresses understanding and agrees to plan.  Final Clinical Impressions(s) / UC Diagnoses   Final diagnoses:  Acute tonsillitis, unspecified etiology    ED Prescriptions    Medication Sig Dispense Auth. Provider   amoxicillin (AMOXIL) 500 MG capsule  Take 1 capsule (500 mg total) by mouth 2 (two) times daily for 10 days. 20 capsule Pawel Soules V, PA-C   ipratropium (ATROVENT) 0.06 % nasal spray Place 2 sprays into both nostrils 4 (four) times daily. 15 mL Threasa Alpha, [place] 09/13/18 1159

## 2018-09-13 NOTE — Discharge Instructions (Addendum)
Rapid strep negative. However, given your exam, will cover you empirically for bacterial infection with amoxicillin. As discussed, symptoms can still be due to viral illness/ drainage down your throat. This usually takes 7-10 days to resolve. You can take over the counter allergy medicine such as Flonase, Zyrtec-D for nasal congestion/drainage. You can use over the counter nasal saline rinse such as neti pot for nasal congestion. Monitor for any worsening of symptoms, swelling of the throat, trouble breathing, trouble swallowing, leaning forward to breath, drooling, go to the emergency department for further evaluation needed. ° °For sore throat/cough try using a honey-based tea. Use 3 teaspoons of honey with juice squeezed from half lemon. Place shaved pieces of ginger into 1/2-1 cup of water and warm over stove top. Then mix the ingredients and repeat every 4 hours as needed. °

## 2018-09-13 NOTE — ED Triage Notes (Signed)
Pt presents to [facility] for assessment fo 3-4 days of sore throat, body aches, and fevers.

## 2018-09-13 NOTE — ED Notes (Signed)
Patient able to ambulate independently  

## 2018-09-14 ENCOUNTER — Ambulatory Visit: Payer: Self-pay | Admitting: Physician Assistant

## 2018-09-15 LAB — CULTURE, GROUP A STREP (THRC)

## 2018-11-24 ENCOUNTER — Ambulatory Visit (INDEPENDENT_AMBULATORY_CARE_PROVIDER_SITE_OTHER): Payer: BLUE CROSS/BLUE SHIELD | Admitting: Family Medicine

## 2018-11-24 ENCOUNTER — Encounter: Payer: Self-pay | Admitting: Family Medicine

## 2018-11-24 ENCOUNTER — Other Ambulatory Visit: Payer: Self-pay

## 2018-11-24 VITALS — BP 122/76 | HR 96 | Temp 97.7°F | Resp 14 | Ht 70.0 in | Wt 200.8 lb

## 2018-11-24 DIAGNOSIS — J45901 Unspecified asthma with (acute) exacerbation: Secondary | ICD-10-CM

## 2018-11-24 DIAGNOSIS — L282 Other prurigo: Secondary | ICD-10-CM

## 2018-11-24 MED ORDER — ALBUTEROL SULFATE HFA 108 (90 BASE) MCG/ACT IN AERS: 2.0000 | INHALATION_SPRAY | Freq: Four times a day (QID) | RESPIRATORY_TRACT | 1 refills | Status: AC | PRN

## 2018-11-24 MED ORDER — PREDNISONE 20 MG PO TABS: ORAL_TABLET | ORAL | 0 refills | Status: AC

## 2018-11-24 MED ORDER — FLUTICASONE PROPIONATE HFA 110 MCG/ACT IN AERO: 1.0000 | INHALATION_SPRAY | Freq: Two times a day (BID) | RESPIRATORY_TRACT | 5 refills | Status: AC

## 2018-11-24 NOTE — Patient Instructions (Addendum)
Start flovent for asthma control. Recheck in next 2-3 months to determine control. sooner if worse. Albuterol if needed for wheeze.   Rash could be contact dermatitis or allergy cause. As recurring, can try prednisone.  Ok to continue steroid cream if needed. Benadryl if needed for itching at night.   If any new symptoms such as cough or return of fever, return to discuss further workup.    Asthma, Adult  Asthma is a long-term (chronic) condition that causes recurrent episodes in which the airways become tight and narrow. The airways are the passages that lead from the nose and mouth down into the lungs. Asthma episodes, also called asthma attacks, can cause coughing, wheezing, shortness of breath, and chest pain. The airways can also fill with mucus. During an attack, it can be difficult to breathe. Asthma attacks can range from minor to life threatening. Asthma cannot be cured, but medicines and lifestyle changes can help control it and treat acute attacks. What are the causes? This condition is believed to be caused by inherited (genetic) and environmental factors, but its exact cause is not known. There are many things that can bring on an asthma attack or make asthma symptoms worse (triggers). Asthma triggers are different for each person. Common triggers include:  Mold.  Dust.  Cigarette smoke.  Cockroaches.  Things that can cause allergy symptoms (allergens), such as animal dander or pollen from trees or grass.  Air pollutants such as household cleaners, wood smoke, smog, or Therapist, occupational.  Cold air, weather changes, and winds (which increase molds and pollen in the air).  Strong emotional expressions such as crying or laughing hard.  Stress.  Certain medicines (such as aspirin) or types of medicines (such as beta-blockers).  Sulfites in foods and drinks. Foods and drinks that may contain sulfites include dried fruit, potato chips, and sparkling grape juice.  Infections  or inflammatory conditions such as the flu, a cold, or inflammation of the nasal membranes (rhinitis).  Gastroesophageal reflux disease (GERD).  Exercise or strenuous activity. What are the signs or symptoms? Symptoms of this condition may occur right after asthma is triggered or many hours later. Symptoms include:  Wheezing. This can sound like whistling when you breathe.  Excessive nighttime or early morning coughing.  Frequent or severe coughing with a common cold.  Chest tightness.  Shortness of breath.  Tiredness (fatigue) with minimal activity. How is this diagnosed? This condition is diagnosed based on:  Your medical history.  A physical exam.  Tests, which may include: ? Lung function studies and pulmonary studies (spirometry). These tests can evaluate the flow of air in your lungs. ? Allergy tests. ? Imaging tests, such as X-rays. How is this treated? There is no cure for this condition, but treatment can help control your symptoms. Treatment for asthma usually involves:  Identifying and avoiding your asthma triggers.  Using medicines to control your symptoms. Generally, two types of medicines are used to treat asthma: ? Controller medicines. These help prevent asthma symptoms from occurring. They are usually taken every day. ? Fast-acting reliever or rescue medicines. These quickly relieve asthma symptoms by widening the narrow and tight airways. They are used as needed and provide short-term relief.  Using supplemental oxygen. This may be needed during a severe episode.  Using other medicines, such as: ? Allergy medicines, such as antihistamines, if your asthma attacks are triggered by allergens. ? Immune medicines (immunomodulators). These are medicines that help control the immune system.  Creating an  asthma action plan. An asthma action plan is a written plan for managing and treating your asthma attacks. This plan includes: ? A list of your asthma triggers  and how to avoid them. ? Information about when medicines should be taken and when their dosage should be changed. ? Instructions about using a device called a peak flow meter. A peak flow meter measures how well the lungs are working and the severity of your asthma. It helps you monitor your condition. Follow these instructions at home: Controlling your home environment Control your home environment in the following ways to help avoid triggers and prevent asthma attacks:  Change your heating and air conditioning filter regularly.  Limit your use of fireplaces and wood stoves.  Get rid of pests (such as roaches and mice) and their droppings.  Throw away plants if you see mold on them.  Clean floors and dust surfaces regularly. Use unscented cleaning products.  Try to have someone else vacuum for you regularly. Stay out of rooms while they are being vacuumed and for a short while afterward. If you vacuum, use a dust mask from a hardware store, a double-layered or microfilter vacuum cleaner bag, or a vacuum cleaner with a HEPA filter.  Replace carpet with wood, tile, or vinyl flooring. Carpet can trap dander and dust.  Use allergy-proof pillows, mattress covers, and box spring covers.  Keep your bedroom a trigger-free room.  Avoid pets and keep windows closed when allergens are in the air.  Wash beddings every week in hot water and dry them in a dryer.  Use blankets that are made of polyester or cotton.  Clean bathrooms and kitchens with bleach. If possible, have someone repaint the walls in these rooms with mold-resistant paint. Stay out of the rooms that are being cleaned and painted.  Wash your hands often with soap and water. If soap and water are not available, use hand sanitizer.  Do not allow anyone to smoke in your home. General instructions  Take over-the-counter and prescription medicines only as told by your health care provider. ? Speak with your health care provider  if you have questions about how or when to take the medicines. ? Make note if you are requiring more frequent dosages.  Do not use any products that contain nicotine or tobacco, such as cigarettes and e-cigarettes. If you need help quitting, ask your health care provider. Also, avoid being exposed to secondhand smoke.  Use a peak flow meter as told by your health care provider. Record and keep track of the readings.  Understand and use the asthma action plan to help minimize, or stop an asthma attack, without needing to seek medical care.  Make sure you stay up to date on your yearly vaccinations as told by your health care provider. This may include vaccines for the flu and pneumonia.  Avoid outdoor activities when allergen counts are high and when air quality is low.  Wear a ski mask that covers your nose and mouth during outdoor winter activities. Exercise indoors on cold days if you can.  Warm up before exercising, and take time for a cool-down period after exercise.  Keep all follow-up visits as told by your health care provider. This is important. Where to find more information  For information about asthma, turn to the Centers for Disease Control and Prevention at http://www.mills-berg.com/.htm  For air quality information, turn to [employer] at [employer] Contact a health care provider if:  You have wheezing, shortness of breath,  or a cough even while you are taking medicine to prevent attacks.  The mucus you cough up (sputum) is thicker than usual.  Your sputum changes from clear or white to yellow, green, gray, or bloody.  Your medicines are causing side effects, such as a rash, itching, swelling, or trouble breathing.  You need to use a reliever medicine more than 2-3 times a week.  Your peak flow reading is still at 50-79% of your personal best after following your action plan for 1 hour.  You have a fever. Get help right away if:  You are getting worse and do  not respond to treatment during an asthma attack.  You are short of breath when at rest or when doing very little physical activity.  You have difficulty eating, drinking, or talking.  You have chest pain or tightness.  You develop a fast heartbeat or palpitations.  You have a bluish color to your lips or fingernails.  You are light-headed or dizzy, or you faint.  Your peak flow reading is less than 50% of your personal best.  You feel too tired to breathe normally. Summary  Asthma is a long-term (chronic) condition that causes recurrent episodes in which the airways become tight and narrow. These episodes can cause coughing, wheezing, shortness of breath, and chest pain.  Asthma cannot be cured, but medicines and lifestyle changes can help control it and treat acute attacks.  Make sure you understand how to avoid triggers and how and when to use your medicines.  Asthma attacks can range from minor to life threatening. Get help right away if you have an asthma attack and do not respond to treatment with your usual rescue medicines. This information is not intended to replace advice given to you by your health care provider. Make sure you discuss any questions you have with your health care provider. Document Released: 08/24/2005 Document Revised: 09/28/2016 Document Reviewed: 09/28/2016 Elsevier Interactive Patient Education  2019 [employer]  Contact Dermatitis Dermatitis is redness, soreness, and swelling (inflammation) of the skin. Contact dermatitis is a reaction to certain substances that touch the skin. Many different substances can cause contact dermatitis. There are two types of contact dermatitis:  Irritant contact dermatitis. This type is caused by something that irritates your skin, such as having dry hands from washing them too often with soap. This type does not require previous exposure to the substance for a reaction to occur. This is the most common  type.  Allergic contact dermatitis. This type is caused by a substance that you are allergic to, such as poison ivy. This type occurs when you have been exposed to the substance (allergen) and develop a sensitivity to it. Dermatitis may develop soon after your first exposure to the allergen, or it may not develop until the next time you are exposed and every time thereafter. What are the causes? Irritant contact dermatitis is most commonly caused by exposure to:  Makeup.  Soaps.  Detergents.  Bleaches.  Acids.  Metal salts, such as nickel. Allergic contact dermatitis is most commonly caused by exposure to:  Poisonous plants.  Chemicals.  Jewelry.  Latex.  Medicines.  Preservatives in products, such as clothing. What increases the risk? You are more likely to develop this condition if you have:  A job that exposes you to irritants or allergens.  Certain medical conditions, such as asthma or eczema. What are the signs or symptoms? Symptoms of this condition may occur on your body anywhere the irritant  has touched you or is touched by you.  Symptoms include: ? Dryness or flaking. ? Redness. ? Cracks. ? Itching. ? Pain or a burning feeling. ? Blisters. ? Drainage of small amounts of blood or clear fluid from skin cracks. With allergic contact dermatitis, there may also be swelling in areas such as the eyelids, mouth, or genitals. How is this diagnosed? This condition is diagnosed with a medical history and physical exam.  A patch skin test may be performed to help determine the cause.  If the condition is related to your job, you may need to see an occupational medicine specialist. How is this treated? This condition is treated by checking for the cause of the reaction and protecting your skin from further contact. Treatment may also include:  Steroid creams or ointments. Oral steroid medicines may be needed in more severe cases.  Antibiotic medicines or  antibacterial ointments, if a skin infection is present.  Antihistamine lotion or an antihistamine taken by mouth to ease itching.  A bandage (dressing). Follow these instructions at home: Skin care  Moisturize your skin as needed.  Apply cool compresses to the affected areas.  Try applying baking soda paste to your skin. Stir water into baking soda until it reaches a paste-like consistency.  Do not scratch your skin, and avoid friction to the affected area.  Avoid the use of soaps, perfumes, and dyes. Medicines  Take or apply over-the-counter and prescription medicines only as told by your health care provider.  If you were prescribed an antibiotic medicine, take or apply the antibiotic as told by your health care provider. Do not stop using the antibiotic even if your condition improves. Bathing  Try taking a bath with: ? Epsom salts. Follow the instructions on the packaging. You can get these at your local pharmacy or grocery store. ? Baking soda. Pour a small amount into the bath as directed by your health care provider. ? Colloidal oatmeal. Follow the instructions on the packaging. You can get this at your local pharmacy or grocery store.  Bathe less frequently, such as every other day.  Bathe in lukewarm water. Avoid using hot water. Bandage care  If you were given a bandage (dressing), change it as told by your health care provider.  Wash your hands with soap and water before and after you change your dressing. If soap and water are not available, use hand sanitizer. General instructions  Avoid the substance that caused your reaction. If you do not know what caused it, keep a journal to try to track what caused it. Write down: ? What you eat. ? What cosmetic products you use. ? What you drink. ? What you wear in the affected area. This includes jewelry.  Check the affected areas every day for signs of infection. Check for: ? More redness, swelling, or  pain. ? More fluid or blood. ? Warmth. ? Pus or a bad smell.  Keep all follow-up visits as told by your health care provider. This is important. Contact a health care provider if:  Your condition does not improve with treatment.  Your condition gets worse.  You have signs of infection such as swelling, tenderness, redness, soreness, or warmth in the affected area.  You have a fever.  You have new symptoms. Get help right away if:  You have a severe headache, neck pain, or neck stiffness.  You vomit.  You feel very sleepy.  You notice red streaks coming from the affected area.  Your  bone or joint underneath the affected area becomes painful after the skin has healed.  The affected area turns darker.  You have difficulty breathing. Summary  Dermatitis is redness, soreness, and swelling (inflammation) of the skin. Contact dermatitis is a reaction to certain substances that touch the skin.  Symptoms of this condition may occur on your body anywhere the irritant has touched you or is touched by you.  This condition is treated by figuring out what caused the reaction and protecting your skin from further contact. Treatment may also include medicines and skin care.  Avoid the substance that caused your reaction. If you do not know what caused it, keep a journal to try to track what caused it.  Contact a health care provider if your condition gets worse or you have signs of infection such as swelling, tenderness, redness, soreness, or warmth in the affected area. This information is not intended to replace advice given to you by your health care provider. Make sure you discuss any questions you have with your health care provider. Document Released: 08/21/2000 Document Revised: 03/09/2018 Document Reviewed: 03/09/2018 Elsevier Interactive Patient Education  2019 [employer]  Rash, Adult A rash is a change in the color of your skin. A rash can also change the way your skin  feels. There are many different conditions and factors that can cause a rash. Some rashes may disappear after a few days, but some may last for a few weeks. Common causes of rashes include:  Viral infections, such as: ? Colds. ? Measles. ? Hand, foot, and mouth disease.  Bacterial infections, such as: ? Scarlet fever. ? Impetigo.  Fungal infections, such as Candida.  Allergic reactions to food, medicines, or skin care products. Follow these instructions at home: The goal of treatment is to stop the itching and keep the rash from spreading. Pay attention to any changes in your symptoms. Follow these instructions to help with your condition: Medicine Take or apply over-the-counter and prescription medicines only as told by your health care provider. These may include:  Corticosteroid creams to treat red or swollen skin.  Anti-itch lotions.  Oral allergy medicines (antihistamines).  Oral corticosteroids for severe symptoms.  Skin care  Apply cool compresses to the affected areas.  Do not scratch or rub your skin.  Avoid covering the rash. Make sure the rash is exposed to air as much as possible. Managing itching and discomfort  Avoid hot showers or baths, which can make itching worse. A cold shower may help.  Try taking a bath with: ? Epsom salts. Follow manufacturer instructions on the packaging. You can get these at your local pharmacy or grocery store. ? Baking soda. Pour a small amount into the bath as told by your health care provider. ? Colloidal oatmeal. Follow manufacturer instructions on the packaging. You can get this at your local pharmacy or grocery store.  Try applying baking soda paste to your skin. Stir water into baking soda until it reaches a paste-like consistency.  Try applying calamine lotion. This is an over-the-counter lotion that helps to relieve itchiness.  Keep cool and out of the sun. Sweating and being hot can make itching worse. General  instructions   Rest as needed.  Drink enough fluid to keep your urine pale yellow.  Wear loose-fitting clothing.  Avoid scented soaps, detergents, and perfumes. Use gentle soaps, detergents, perfumes, and other cosmetic products.  Avoid any substance that causes your rash. Keep a journal to help track what causes your  rash. Write down: ? What you eat. ? What cosmetic products you use. ? What you drink. ? What you wear. This includes jewelry.  Keep all follow-up visits as told by your health care provider. This is important. Contact a health care provider if:  You sweat at night.  You lose weight.  You urinate more than normal.  You urinate less than normal, or you notice that your urine is a darker color than usual.  You feel weak.  You vomit.  Your skin or the whites of your eyes look yellow (jaundice).  Your skin: ? Tingles. ? Is numb.  Your rash: ? Does not go away after several days. ? Gets worse.  You are: ? Unusually thirsty. ? More tired than normal.  You have: ? New symptoms. ? Pain in your abdomen. ? A fever. ? Diarrhea. Get help right away if you:  Have a fever and your symptoms suddenly get worse.  Develop confusion.  Have a severe headache or a stiff neck.  Have severe joint pains or stiffness.  Have a seizure.  Develop a rash that covers all or most of your body. The rash may or may not be painful.  Develop blisters that: ? Are on top of the rash. ? Grow larger or grow together. ? Are painful. ? Are inside your nose or mouth.  Develop a rash that: ? Looks like purple pinprick-sized spots all over your body. ? Has a "bull's eye" or looks like a target. ? Is not related to sun exposure, is red and painful, and causes your skin to peel. Summary  A rash is a change in the color of your skin. Some rashes disappear after a few days, but some may last for a few weeks.  The goal of treatment is to stop the itching and keep the rash  from spreading.  Take or apply over-the-counter and prescription medicines only as told by your health care provider.  Contact a health care provider if you have new or worsening symptoms.  Keep all follow-up visits as told by your health care provider. This is important. This information is not intended to replace advice given to you by your health care provider. Make sure you discuss any questions you have with your health care provider. Document Released: 08/14/2002 Document Revised: 03/28/2018 Document Reviewed: 03/28/2018 Elsevier Interactive Patient Education  [employer].     If you have lab work done today you will be contacted with your lab results within the next 2 weeks.  If you have not heard from [place] then please contact us. The fastest way to get your results is to register for My Chart.   IF you received an x-ray today, you will receive an invoice from [facility]. Please contact [facility] at [phone] with questions or concerns regarding your invoice.   IF you received labwork today, you will receive an invoice from [facility]. Please contact [facility] at [phone] with questions or concerns regarding your invoice.   Our billing staff will not be able to assist you with questions regarding bills from these companies.  You will be contacted with the lab results as soon as they are available. The fastest way to get your results is to activate your My Chart account. Instructions are located on the last page of this paperwork. If you have not heard from [place] regarding the results in 2 weeks, please contact this office.

## 2018-11-24 NOTE — Progress Notes (Signed)
Subjective:    Patient ID: Brianna Garrett, female    DOB: 10/22/1985, 33 y.o.   MRN: [id number]  HPI Brianna Garrett is a 33 y.o. female Presents today for: Chief Complaint  Patient presents with  . Rash    upper body x14 days (arms, thigh, trunk, back)  . mild asthma    need a refill on inhaler only   Rash: Started on lower back neck - 10 days ago. Small itching bumps, then spread to chest 2-3 days later, then arms. Then lower abdomen at 6 days. No other spread in past 4 days. Getting better with rubbing alcohol (10 times per day), hydrocortisone cream 2-3 times per day, then flared past few days - back, abdomen, breast, neck and arms. No genital involvement, no legs involvement.  No new soap/detergent/dermatologic product. Now using Dove sensitive skin.   sweats first night only, but no measured. 10 days ago, none since that time. Min runny nose when started, still min runny nose.  No cough. Itchy rash.  Coworkers with fever, nausea.  No recent travel in past 2 weeks,  no known exposure to coronavirus infected person. S/p MMR x 2. = 1987 and 1999.   Asthma: Albuterol only.  Ran out 4-5 months ago. Was using albuterol daily, 4-5 times every day.  Last asthma discussion here in 2018.  Worse during allergy season.  Has taken prednisone without difficulty in past.   There are no active problems to display for this patient.  Past Medical History:  Diagnosis Date  . Allergy   . Asthma    Past Surgical History:  Procedure Laterality Date  . HIP SURGERY     Allergies  Allergen Reactions  . Pineapple Anaphylaxis  . Claritin [Loratadine]     "nose bleeds"  . Flonase [Fluticasone Propionate]     "nose bleeds"   Prior to Admission medications   Medication Sig Start Date End Date Taking? Authorizing Provider  albuterol (PROVENTIL HFA;VENTOLIN HFA) 108 (90 Base) MCG/ACT inhaler Inhale 2 puffs into the lungs every 6 (six) hours as needed for wheezing or shortness of  breath. Patient not taking: Reported on 04/29/2018 09/12/16   Billy Fischer, MD   Social History   Socioeconomic History  . Marital status: Single    Spouse name: Not on file  . Number of children: Not on file  . Years of education: Not on file  . Highest education level: Not on file  Occupational History  . Not on file  Social Needs  . Financial resource strain: Not on file  . Food insecurity:    Worry: Not on file    Inability: Not on file  . Transportation needs:    Medical: Not on file    Non-medical: Not on file  Tobacco Use  . Smoking status: Former Smoker    Last attempt to quit: 01/23/2017    Years since quitting: 1.8  . Smokeless tobacco: Never Used  Substance and Sexual Activity  . Alcohol use: Yes    Alcohol/week: 0.0 standard drinks    Comment: occ  . Drug use: No  . Sexual activity: Not on file  Lifestyle  . Physical activity:    Days per week: Not on file    Minutes per session: Not on file  . Stress: Not on file  Relationships  . Social connections:    Talks on phone: Not on file    Gets together: Not on file    Attends religious  service: Not on file    Active member of club or organization: Not on file    Attends meetings of clubs or organizations: Not on file    Relationship status: Not on file  . Intimate partner violence:    Fear of current or ex partner: Not on file    Emotionally abused: Not on file    Physically abused: Not on file    Forced sexual activity: Not on file  Other Topics Concern  . Not on file  Social History Narrative  . Not on file    Review of Systems     Objective:   Physical Exam Vitals signs reviewed.  Constitutional:      General: She is not in acute distress.    Appearance: She is well-developed.  HENT:     Head: Normocephalic and atraumatic.     Right Ear: Hearing, tympanic membrane, ear canal and external ear normal.     Left Ear: Hearing, tympanic membrane, ear canal and external ear normal.     Nose: Nose  normal.     Mouth/Throat:     Pharynx: No oropharyngeal exudate.     Comments: Moist oral mucosa, no Kop[place] spots, Eyes:     Conjunctiva/sclera: Conjunctivae normal.     Pupils: Pupils are equal, round, and reactive to light.  Cardiovascular:     Rate and Rhythm: Normal rate and regular rhythm.     Heart sounds: Normal heart sounds. No murmur.  Pulmonary:     Effort: Pulmonary effort is normal. No respiratory distress.     Breath sounds: Normal breath sounds. No wheezing or rhonchi.  Skin:    General: Skin is warm and dry.     Findings: Rash present.     Comments: Multiple scattered macules with few patches on upper neck, posteriorly, upper chest, upper back, few scattered areas on arms with slight excoriation.  No urticarial lesions seen.  No petechiae/purpura.  Neurological:     Mental Status: She is alert and oriented to person, place, and time.  Psychiatric:        Behavior: Behavior normal.    Vitals:   11/24/18 1653  BP: 122/76  Pulse: 96  Resp: 14  Temp: 97.7 F (36.5 C)  TempSrc: Oral  SpO2: 98%  Weight: 200 lb 12.8 oz (91.1 kg)  Height: '5\' 10"'  (1.778 m)      Assessment & Plan:    Brianna Garrett is a 33 y.o. female Mild asthma with acute exacerbation, unspecified whether persistent - Plan: albuterol (PROVENTIL HFA;VENTOLIN HFA) 108 (90 Base) MCG/ACT inhaler, fluticasone (FLOVENT HFA) 110 MCG/ACT inhaler  -Start Flovent for improved asthma control, albuterol if needed but anticipate lessening use.  If still frequent need with use of Flovent, may need higher dose or addition of LABA.  RTC precautions, ER precautions.  Pruritic rash - Plan: predniSONE (DELTASONE) 20 MG tablet  -Possible contact dermatitis/atopic dermatitis with spread after initial improvement.  Pruritic nature likely allergic/contact.  No systemic symptoms.  Doubt infectious.  -Due to recurrence of symptoms will start prednisone taper, potential side effects and risks were discussed.  RTC  precautions given.  Benadryl at night okay if needed, as well as the topical hydrocortisone temporarily.   Meds ordered this encounter  Medications  . albuterol (PROVENTIL HFA;VENTOLIN HFA) 108 (90 Base) MCG/ACT inhaler    Sig: Inhale 2 puffs into the lungs every 6 (six) hours as needed for wheezing or shortness of breath.    Dispense:  1 Inhaler    Refill:  1  . fluticasone (FLOVENT HFA) 110 MCG/ACT inhaler    Sig: Inhale 1 puff into the lungs 2 (two) times daily.    Dispense:  1 Inhaler    Refill:  5  . predniSONE (DELTASONE) 20 MG tablet    Sig: 3 by mouth for 3 days, then 2 by mouth for 2 days, then 1 by mouth for 2 days, then 1/2 by mouth for 2 days.    Dispense:  16 tablet    Refill:  0   Patient Instructions    Start flovent for asthma control. Recheck in next 2-3 months to determine control. sooner if worse. Albuterol if needed for wheeze.   Rash could be contact dermatitis or allergy cause. As recurring, can try prednisone.  Ok to continue steroid cream if needed. Benadryl if needed for itching at night.   If any new symptoms such as cough or return of fever, return to discuss further workup.    Asthma, Adult  Asthma is a long-term (chronic) condition that causes recurrent episodes in which the airways become tight and narrow. The airways are the passages that lead from the nose and mouth down into the lungs. Asthma episodes, also called asthma attacks, can cause coughing, wheezing, shortness of breath, and chest pain. The airways can also fill with mucus. During an attack, it can be difficult to breathe. Asthma attacks can range from minor to life threatening. Asthma cannot be cured, but medicines and lifestyle changes can help control it and treat acute attacks. What are the causes? This condition is believed to be caused by inherited (genetic) and environmental factors, but its exact cause is not known. There are many things that can bring on an asthma attack or make  asthma symptoms worse (triggers). Asthma triggers are different for each person. Common triggers include:  Mold.  Dust.  Cigarette smoke.  Cockroaches.  Things that can cause allergy symptoms (allergens), such as animal dander or pollen from trees or grass.  Air pollutants such as household cleaners, wood smoke, smog, or Advertising account planner.  Cold air, weather changes, and winds (which increase molds and pollen in the air).  Strong emotional expressions such as crying or laughing hard.  Stress.  Certain medicines (such as aspirin) or types of medicines (such as beta-blockers).  Sulfites in foods and drinks. Foods and drinks that may contain sulfites include dried fruit, potato chips, and sparkling grape juice.  Infections or inflammatory conditions such as the flu, a cold, or inflammation of the nasal membranes (rhinitis).  Gastroesophageal reflux disease (GERD).  Exercise or strenuous activity. What are the signs or symptoms? Symptoms of this condition may occur right after asthma is triggered or many hours later. Symptoms include:  Wheezing. This can sound like whistling when you breathe.  Excessive nighttime or early morning coughing.  Frequent or severe coughing with a common cold.  Chest tightness.  Shortness of breath.  Tiredness (fatigue) with minimal activity. How is this diagnosed? This condition is diagnosed based on:  Your medical history.  A physical exam.  Tests, which may include: ? Lung function studies and pulmonary studies (spirometry). These tests can evaluate the flow of air in your lungs. ? Allergy tests. ? Imaging tests, such as X-rays. How is this treated? There is no cure for this condition, but treatment can help control your symptoms. Treatment for asthma usually involves:  Identifying and avoiding your asthma triggers.  Using medicines to control your symptoms.  Generally, two types of medicines are used to treat asthma: ? Controller  medicines. These help prevent asthma symptoms from occurring. They are usually taken every day. ? Fast-acting reliever or rescue medicines. These quickly relieve asthma symptoms by widening the narrow and tight airways. They are used as needed and provide short-term relief.  Using supplemental oxygen. This may be needed during a severe episode.  Using other medicines, such as: ? Allergy medicines, such as antihistamines, if your asthma attacks are triggered by allergens. ? Immune medicines (immunomodulators). These are medicines that help control the immune system.  Creating an asthma action plan. An asthma action plan is a written plan for managing and treating your asthma attacks. This plan includes: ? A list of your asthma triggers and how to avoid them. ? Information about when medicines should be taken and when their dosage should be changed. ? Instructions about using a device called a peak flow meter. A peak flow meter measures how well the lungs are working and the severity of your asthma. It helps you monitor your condition. Follow these instructions at home: Controlling your home environment Control your home environment in the following ways to help avoid triggers and prevent asthma attacks:  Change your heating and air conditioning filter regularly.  Limit your use of fireplaces and wood stoves.  Get rid of pests (such as roaches and mice) and their droppings.  Throw away plants if you see mold on them.  Clean floors and dust surfaces regularly. Use unscented cleaning products.  Try to have someone else vacuum for you regularly. Stay out of rooms while they are being vacuumed and for a short while afterward. If you vacuum, use a dust mask from a hardware store, a double-layered or microfilter vacuum cleaner bag, or a vacuum cleaner with a HEPA filter.  Replace carpet with wood, tile, or vinyl flooring. Carpet can trap dander and dust.  Use allergy-proof pillows, mattress  covers, and box spring covers.  Keep your bedroom a trigger-free room.  Avoid pets and keep windows closed when allergens are in the air.  Wash beddings every week in hot water and dry them in a dryer.  Use blankets that are made of polyester or cotton.  Clean bathrooms and kitchens with bleach. If possible, have someone repaint the walls in these rooms with mold-resistant paint. Stay out of the rooms that are being cleaned and painted.  Wash your hands often with soap and water. If soap and water are not available, use hand sanitizer.  Do not allow anyone to smoke in your home. General instructions  Take over-the-counter and prescription medicines only as told by your health care provider. ? Speak with your health care provider if you have questions about how or when to take the medicines. ? Make note if you are requiring more frequent dosages.  Do not use any products that contain nicotine or tobacco, such as cigarettes and e-cigarettes. If you need help quitting, ask your health care provider. Also, avoid being exposed to secondhand smoke.  Use a peak flow meter as told by your health care provider. Record and keep track of the readings.  Understand and use the asthma action plan to help minimize, or stop an asthma attack, without needing to seek medical care.  Make sure you stay up to date on your yearly vaccinations as told by your health care provider. This may include vaccines for the flu and pneumonia.  Avoid outdoor activities when allergen counts are high and  when air quality is low.  Wear a ski mask that covers your nose and mouth during outdoor winter activities. Exercise indoors on cold days if you can.  Warm up before exercising, and take time for a cool-down period after exercise.  Keep all follow-up visits as told by your health care provider. This is important. Where to find more information  For information about asthma, turn to the Centers for Disease Control  and Prevention at http://www.clark.net/.htm  For air quality information, turn to [employer] at WeightRating.nl Contact a health care provider if:  You have wheezing, shortness of breath, or a cough even while you are taking medicine to prevent attacks.  The mucus you cough up (sputum) is thicker than usual.  Your sputum changes from clear or white to yellow, green, gray, or bloody.  Your medicines are causing side effects, such as a rash, itching, swelling, or trouble breathing.  You need to use a reliever medicine more than 2-3 times a week.  Your peak flow reading is still at 50-79% of your personal best after following your action plan for 1 hour.  You have a fever. Get help right away if:  You are getting worse and do not respond to treatment during an asthma attack.  You are short of breath when at rest or when doing very little physical activity.  You have difficulty eating, drinking, or talking.  You have chest pain or tightness.  You develop a fast heartbeat or palpitations.  You have a bluish color to your lips or fingernails.  You are light-headed or dizzy, or you faint.  Your peak flow reading is less than 50% of your personal best.  You feel too tired to breathe normally. Summary  Asthma is a long-term (chronic) condition that causes recurrent episodes in which the airways become tight and narrow. These episodes can cause coughing, wheezing, shortness of breath, and chest pain.  Asthma cannot be cured, but medicines and lifestyle changes can help control it and treat acute attacks.  Make sure you understand how to avoid triggers and how and when to use your medicines.  Asthma attacks can range from minor to life threatening. Get help right away if you have an asthma attack and do not respond to treatment with your usual rescue medicines. This information is not intended to replace advice given to you by your health care provider. Make sure you discuss any  questions you have with your health care provider. Document Released: 08/24/2005 Document Revised: 09/28/2016 Document Reviewed: 09/28/2016 Elsevier Interactive Patient Education  2019 [place] Dermatitis Dermatitis is redness, soreness, and swelling (inflammation) of the skin. Contact dermatitis is a reaction to certain substances that touch the skin. Many different substances can cause contact dermatitis. There are two types of contact dermatitis:  Irritant contact dermatitis. This type is caused by something that irritates your skin, such as having dry hands from washing them too often with soap. This type does not require previous exposure to the substance for a reaction to occur. This is the most common type.  Allergic contact dermatitis. This type is caused by a substance that you are allergic to, such as poison ivy. This type occurs when you have been exposed to the substance (allergen) and develop a sensitivity to it. Dermatitis may develop soon after your first exposure to the allergen, or it may not develop until the next time you are exposed and every time thereafter. What are the causes? Irritant contact dermatitis is most  commonly caused by exposure to:  Makeup.  Soaps.  Detergents.  Bleaches.  Acids.  Metal salts, such as nickel. Allergic contact dermatitis is most commonly caused by exposure to:  Poisonous plants.  Chemicals.  Jewelry.  Latex.  Medicines.  Preservatives in products, such as clothing. What increases the risk? You are more likely to develop this condition if you have:  A job that exposes you to irritants or allergens.  Certain medical conditions, such as asthma or eczema. What are the signs or symptoms? Symptoms of this condition may occur on your body anywhere the irritant has touched you or is touched by you.  Symptoms include: ? Dryness or flaking. ? Redness. ? Cracks. ? Itching. ? Pain or a burning feeling. ?  Blisters. ? Drainage of small amounts of blood or clear fluid from skin cracks. With allergic contact dermatitis, there may also be swelling in areas such as the eyelids, mouth, or genitals. How is this diagnosed? This condition is diagnosed with a medical history and physical exam.  A patch skin test may be performed to help determine the cause.  If the condition is related to your job, you may need to see an occupational medicine specialist. How is this treated? This condition is treated by checking for the cause of the reaction and protecting your skin from further contact. Treatment may also include:  Steroid creams or ointments. Oral steroid medicines may be needed in more severe cases.  Antibiotic medicines or antibacterial ointments, if a skin infection is present.  Antihistamine lotion or an antihistamine taken by mouth to ease itching.  A bandage (dressing). Follow these instructions at home: Skin care  Moisturize your skin as needed.  Apply cool compresses to the affected areas.  Try applying baking soda paste to your skin. Stir water into baking soda until it reaches a paste-like consistency.  Do not scratch your skin, and avoid friction to the affected area.  Avoid the use of soaps, perfumes, and dyes. Medicines  Take or apply over-the-counter and prescription medicines only as told by your health care provider.  If you were prescribed an antibiotic medicine, take or apply the antibiotic as told by your health care provider. Do not stop using the antibiotic even if your condition improves. Bathing  Try taking a bath with: ? Epsom salts. Follow the instructions on the packaging. You can get these at your local pharmacy or grocery store. ? Baking soda. Pour a small amount into the bath as directed by your health care provider. ? Colloidal oatmeal. Follow the instructions on the packaging. You can get this at your local pharmacy or grocery store.  Bathe less  frequently, such as every other day.  Bathe in lukewarm water. Avoid using hot water. Bandage care  If you were given a bandage (dressing), change it as told by your health care provider.  Wash your hands with soap and water before and after you change your dressing. If soap and water are not available, use hand sanitizer. General instructions  Avoid the substance that caused your reaction. If you do not know what caused it, keep a journal to try to track what caused it. Write down: ? What you eat. ? What cosmetic products you use. ? What you drink. ? What you wear in the affected area. This includes jewelry.  Check the affected areas every day for signs of infection. Check for: ? More redness, swelling, or pain. ? More fluid or blood. ? Warmth. ? Pus or  a bad smell.  Keep all follow-up visits as told by your health care provider. This is important. Contact a health care provider if:  Your condition does not improve with treatment.  Your condition gets worse.  You have signs of infection such as swelling, tenderness, redness, soreness, or warmth in the affected area.  You have a fever.  You have new symptoms. Get help right away if:  You have a severe headache, neck pain, or neck stiffness.  You vomit.  You feel very sleepy.  You notice red streaks coming from the affected area.  Your bone or joint underneath the affected area becomes painful after the skin has healed.  The affected area turns darker.  You have difficulty breathing. Summary  Dermatitis is redness, soreness, and swelling (inflammation) of the skin. Contact dermatitis is a reaction to certain substances that touch the skin.  Symptoms of this condition may occur on your body anywhere the irritant has touched you or is touched by you.  This condition is treated by figuring out what caused the reaction and protecting your skin from further contact. Treatment may also include medicines and skin care.   Avoid the substance that caused your reaction. If you do not know what caused it, keep a journal to try to track what caused it.  Contact a health care provider if your condition gets worse or you have signs of infection such as swelling, tenderness, redness, soreness, or warmth in the affected area. This information is not intended to replace advice given to you by your health care provider. Make sure you discuss any questions you have with your health care provider. Document Released: 08/21/2000 Document Revised: 03/09/2018 Document Reviewed: 03/09/2018 Elsevier Interactive Patient Education  2019 [employer]  Rash, Adult A rash is a change in the color of your skin. A rash can also change the way your skin feels. There are many different conditions and factors that can cause a rash. Some rashes may disappear after a few days, but some may last for a few weeks. Common causes of rashes include:  Viral infections, such as: ? Colds. ? Measles. ? Hand, foot, and mouth disease.  Bacterial infections, such as: ? Scarlet fever. ? Impetigo.  Fungal infections, such as Candida.  Allergic reactions to food, medicines, or skin care products. Follow these instructions at home: The goal of treatment is to stop the itching and keep the rash from spreading. Pay attention to any changes in your symptoms. Follow these instructions to help with your condition: Medicine Take or apply over-the-counter and prescription medicines only as told by your health care provider. These may include:  Corticosteroid creams to treat red or swollen skin.  Anti-itch lotions.  Oral allergy medicines (antihistamines).  Oral corticosteroids for severe symptoms.  Skin care  Apply cool compresses to the affected areas.  Do not scratch or rub your skin.  Avoid covering the rash. Make sure the rash is exposed to air as much as possible. Managing itching and discomfort  Avoid hot showers or baths, which can  make itching worse. A cold shower may help.  Try taking a bath with: ? Epsom salts. Follow manufacturer instructions on the packaging. You can get these at your local pharmacy or grocery store. ? Baking soda. Pour a small amount into the bath as told by your health care provider. ? Colloidal oatmeal. Follow manufacturer instructions on the packaging. You can get this at your local pharmacy or grocery store.  Try applying baking  soda paste to your skin. Stir water into baking soda until it reaches a paste-like consistency.  Try applying calamine lotion. This is an over-the-counter lotion that helps to relieve itchiness.  Keep cool and out of the sun. Sweating and being hot can make itching worse. General instructions   Rest as needed.  Drink enough fluid to keep your urine pale yellow.  Wear loose-fitting clothing.  Avoid scented soaps, detergents, and perfumes. Use gentle soaps, detergents, perfumes, and other cosmetic products.  Avoid any substance that causes your rash. Keep a journal to help track what causes your rash. Write down: ? What you eat. ? What cosmetic products you use. ? What you drink. ? What you wear. This includes jewelry.  Keep all follow-up visits as told by your health care provider. This is important. Contact a health care provider if:  You sweat at night.  You lose weight.  You urinate more than normal.  You urinate less than normal, or you notice that your urine is a darker color than usual.  You feel weak.  You vomit.  Your skin or the whites of your eyes look yellow (jaundice).  Your skin: ? Tingles. ? Is numb.  Your rash: ? Does not go away after several days. ? Gets worse.  You are: ? Unusually thirsty. ? More tired than normal.  You have: ? New symptoms. ? Pain in your abdomen. ? A fever. ? Diarrhea. Get help right away if you:  Have a fever and your symptoms suddenly get worse.  Develop confusion.  Have a severe  headache or a stiff neck.  Have severe joint pains or stiffness.  Have a seizure.  Develop a rash that covers all or most of your body. The rash may or may not be painful.  Develop blisters that: ? Are on top of the rash. ? Grow larger or grow together. ? Are painful. ? Are inside your nose or mouth.  Develop a rash that: ? Looks like purple pinprick-sized spots all over your body. ? Has a "bull's eye" or looks like a target. ? Is not related to sun exposure, is red and painful, and causes your skin to peel. Summary  A rash is a change in the color of your skin. Some rashes disappear after a few days, but some may last for a few weeks.  The goal of treatment is to stop the itching and keep the rash from spreading.  Take or apply over-the-counter and prescription medicines only as told by your health care provider.  Contact a health care provider if you have new or worsening symptoms.  Keep all follow-up visits as told by your health care provider. This is important. This information is not intended to replace advice given to you by your health care provider. Make sure you discuss any questions you have with your health care provider. Document Released: 08/14/2002 Document Revised: 03/28/2018 Document Reviewed: 03/28/2018 Elsevier Interactive Patient Education  [employer].     If you have lab work done today you will be contacted with your lab results within the next 2 weeks.  If you have not heard from [place] then please contact us. The fastest way to get your results is to register for My Chart.   IF you received an x-ray today, you will receive an invoice from [facility]. Please contact [facility] at [phone] with questions or concerns regarding your invoice.   IF you received labwork today, you will receive an invoice  from [facility]. Please contact [facility] at [phone] with questions or concerns regarding your invoice.   Our billing staff  will not be able to assist you with questions regarding bills from these companies.  You will be contacted with the lab results as soon as they are available. The fastest way to get your results is to activate your My Chart account. Instructions are located on the last page of this paperwork. If you have not heard from [place] regarding the results in 2 weeks, please contact this office.       Signed,   Merri Ray, MD [facility].  11/27/18 1:53 PM

## 2018-12-14 DIAGNOSIS — N946 Dysmenorrhea, unspecified: Secondary | ICD-10-CM | POA: Diagnosis not present

## 2018-12-14 DIAGNOSIS — L309 Dermatitis, unspecified: Secondary | ICD-10-CM | POA: Diagnosis not present

## 2018-12-14 DIAGNOSIS — J301 Allergic rhinitis due to pollen: Secondary | ICD-10-CM | POA: Diagnosis not present

## 2019-01-03 DIAGNOSIS — J069 Acute upper respiratory infection, unspecified: Secondary | ICD-10-CM | POA: Diagnosis not present

## 2019-01-16 DIAGNOSIS — F064 Anxiety disorder due to known physiological condition: Secondary | ICD-10-CM | POA: Diagnosis not present

## 2019-01-16 DIAGNOSIS — M150 Primary generalized (osteo)arthritis: Secondary | ICD-10-CM | POA: Diagnosis not present

## 2019-01-16 DIAGNOSIS — F3281 Premenstrual dysphoric disorder: Secondary | ICD-10-CM | POA: Diagnosis not present

## 2019-01-16 DIAGNOSIS — J4520 Mild intermittent asthma, uncomplicated: Secondary | ICD-10-CM | POA: Diagnosis not present

## 2019-01-17 DIAGNOSIS — M2241 Chondromalacia patellae, right knee: Secondary | ICD-10-CM | POA: Diagnosis not present

## 2019-01-17 DIAGNOSIS — M25561 Pain in right knee: Secondary | ICD-10-CM | POA: Diagnosis not present

## 2019-11-29 ENCOUNTER — Emergency Department (HOSPITAL_COMMUNITY)
Admission: EM | Admit: 2019-11-29 | Discharge: 2019-11-29 | Disposition: A | Discharge status: Home / Self Care | Payer: BC Managed Care – PPO | Attending: Emergency Medicine | Admitting: Emergency Medicine

## 2019-11-29 ENCOUNTER — Other Ambulatory Visit: Payer: Self-pay

## 2019-11-29 ENCOUNTER — Emergency Department (HOSPITAL_COMMUNITY): Payer: BC Managed Care – PPO

## 2019-11-29 ENCOUNTER — Encounter (HOSPITAL_COMMUNITY): Payer: Self-pay

## 2019-11-29 DIAGNOSIS — R55 Syncope and collapse: Secondary | ICD-10-CM | POA: Insufficient documentation

## 2019-11-29 DIAGNOSIS — J45909 Unspecified asthma, uncomplicated: Secondary | ICD-10-CM | POA: Insufficient documentation

## 2019-11-29 DIAGNOSIS — Y907 Blood alcohol level of 200-239 mg/100 ml: Secondary | ICD-10-CM | POA: Insufficient documentation

## 2019-11-29 DIAGNOSIS — Z87891 Personal history of nicotine dependence: Secondary | ICD-10-CM | POA: Insufficient documentation

## 2019-11-29 DIAGNOSIS — R0602 Shortness of breath: Secondary | ICD-10-CM | POA: Diagnosis not present

## 2019-11-29 DIAGNOSIS — Z79899 Other long term (current) drug therapy: Secondary | ICD-10-CM | POA: Insufficient documentation

## 2019-11-29 DIAGNOSIS — R5383 Other fatigue: Secondary | ICD-10-CM | POA: Diagnosis not present

## 2019-11-29 DIAGNOSIS — F10920 Alcohol use, unspecified with intoxication, uncomplicated: Secondary | ICD-10-CM | POA: Insufficient documentation

## 2019-11-29 DIAGNOSIS — F10929 Alcohol use, unspecified with intoxication, unspecified: Secondary | ICD-10-CM | POA: Diagnosis not present

## 2019-11-29 LAB — BASIC METABOLIC PANEL
Anion gap: 9 (ref 5–15)
BUN: 11 mg/dL (ref 6–20)
CO2: 24 mmol/L (ref 22–32)
Calcium: 9 mg/dL (ref 8.9–10.3)
Chloride: 111 mmol/L (ref 98–111)
Creatinine, Ser: 0.81 mg/dL (ref 0.44–1.00)
GFR calc Af Amer: >60 mL/min (ref >60)
GFR calc non Af Amer: >60 mL/min (ref >60)
Glucose, Bld: 94 mg/dL (ref 70–99)
Potassium: 3.3 mmol/L — ABNORMAL LOW (ref 3.5–5.1)
Sodium: 144 mmol/L (ref 135–145)

## 2019-11-29 LAB — RAPID URINE DRUG SCREEN, HOSP PERFORMED
Amphetamines: NOT DETECTED
Barbiturates: NOT DETECTED
Benzodiazepines: NOT DETECTED
Cocaine: POSITIVE — AB
Opiates: NOT DETECTED
Tetrahydrocannabinol: POSITIVE — AB

## 2019-11-29 LAB — I-STAT BETA HCG BLOOD, ED (MC, WL, AP ONLY): I-stat hCG, quantitative: <5 m[IU]/mL (ref <5)

## 2019-11-29 LAB — CBC
HCT: 40.4 % (ref 36.0–46.0)
Hemoglobin: 13.5 g/dL (ref 12.0–15.0)
MCH: 32.2 pg (ref 26.0–34.0)
MCHC: 33.4 g/dL (ref 30.0–36.0)
MCV: 96.4 fL (ref 80.0–100.0)
Platelets: 211 10*3/uL (ref 150–400)
RBC: 4.19 MIL/uL (ref 3.87–5.11)
RDW: 14.6 % (ref 11.5–15.5)
WBC: 5.8 10*3/uL (ref 4.0–10.5)
nRBC: 0 % (ref 0.0–0.2)

## 2019-11-29 LAB — CK: Total CK: 210 U/L (ref 38–234)

## 2019-11-29 LAB — ETHANOL: Alcohol, Ethyl (B): 208 mg/dL — ABNORMAL HIGH (ref <10)

## 2019-11-29 LAB — TROPONIN I (HIGH SENSITIVITY): Troponin I (High Sensitivity): <2 ng/L (ref <18)

## 2019-11-29 MED ORDER — ALBUTEROL SULFATE HFA 108 (90 BASE) MCG/ACT IN AERS
2.0000 | INHALATION_SPRAY | Freq: Once | RESPIRATORY_TRACT | Status: AC
  Administered 2019-11-29: 2 via RESPIRATORY_TRACT
  Filled 2019-11-29: qty 6.7

## 2019-11-29 MED ORDER — SODIUM CHLORIDE 0.9 % IV BOLUS
500.0000 mL | Freq: Once | INTRAVENOUS | Status: AC
  Administered 2019-11-29: 09:00:00 500 mL via INTRAVENOUS

## 2019-11-29 MED ORDER — ALBUTEROL SULFATE HFA 108 (90 BASE) MCG/ACT IN AERS: 1.0000 | INHALATION_SPRAY | Freq: Four times a day (QID) | RESPIRATORY_TRACT | 2 refills | Status: AC | PRN

## 2019-11-29 NOTE — ED Notes (Signed)
Mother at bedside.

## 2019-11-29 NOTE — Discharge Instructions (Addendum)
You were seen in the ER today for an episode of unresponsiveness this morning.  Your workup, including ECG, xray, and blood tests, were reassuring.  Specifically, we did not see any signs of a heart attack, arrhythmia, anemia, severe dehydration, infection, or seizure.  Your alcohol level was extremely high today, and your urine drug screen showed signs of cocaine and THC (marijuana).  All of this could be causing your symptoms today.  I strongly recommended that you stop drinking or using these drugs.  This is your second ER visit for loss of consciousness.  I recommend that you schedule an appointment with a cardiologist (heart doctor) as an outpatient.  They may wish to perform more tests, such as an echocardiogram, to look at your heart.  I included the [facility] office number in your papers.  Your mother also asked for a neurologist office number for evaluation for possible seizures.  While I do not believe this was a seizure today, given your family history, it may be reasonable to be seen in their office.  Finally, I prescribed you more albuterol today.  You were wheezing in the ER, and we gave you breathing treatments.

## 2019-11-29 NOTE — ED Triage Notes (Signed)
Patient took 2 shots of liquor last night at home and then passed out. Patient's fiance found patient "unresponsive and had to do sternal rubs and slapping patient in order to wake patient up and get her to breathe". Patient c/o no pain. Hx: asthma. Patient states "I can hardly keep my eyes open and I am so fatigued".

## 2019-11-29 NOTE — ED Provider Notes (Signed)
[facility] DEPT Provider Note   CSN: [id number] Arrival date & time: 11/29/19  9563     History CC:  "I Passed out and I feel tired."  Brianna Garrett is a 34 y.o. female past medical history of asthma, syncope, presented to the emergency department with lethargy and possible loss of consciousness.  Patient reports that she was at home with her fianc yesterday evening.  The patient reports that she drank exactly 2 shots of liquor.  She denies that she used any drugs or any recreational substances or took any other medications.  She says "I don't remember what happened afterwards".  She says she woke up this morning to her fianc sternal rubbing her chest.  She says she feels extremely fatigued.  She does not have active chest pain aside from soreness where she was sternal rubbed.  She did not want to come to the hospital but her fianc and her mother forced her to.    She has no hx of seizures.  There was no witnessed seizure activity.  She does have a family hx of seizures in her mother.  She says she is run out of her albuterol inhaler and feels mildly short of breath, with chest tightness consistent with her asthma.  She denies a headache.  She reports lightheadedness.  She reports feeling sluggish and tired all over.  Initially the patient tells me that she has never had an episode of syncope for.  However chart review shows that she had a loss of consciousness in August 2019, at that time seated in a chair at work.  Prior to that, she claims no other episodes of syncope.  No family hx of sudden death or genetic arrhythmias.    No hemoptysis or asymmetric LE edema. Patient denies personal or family history of DVT or PE. No recent hormone use (including OCP); travel for >6 hours; prolonged immobilization for greater than 3 days; surgeries or trauma in the last 4 weeks; or malignancy with treatment within 6 months.  HPI     Past Medical History:    Diagnosis Date  . Allergy   . Asthma     There are no problems to display for this patient.   Past Surgical History:  Procedure Laterality Date  . HIP SURGERY       OB History   No obstetric history on file.     No family history on file.  Social History   Tobacco Use  . Smoking status: Former Smoker    Quit date: 01/23/2017    Years since quitting: 2.8  . Smokeless tobacco: Never Used  Substance Use Topics  . Alcohol use: Yes    Alcohol/week: 0.0 standard drinks    Comment: occ  . Drug use: No    Home Medications Prior to Admission medications   Medication Sig Start Date End Date Taking? Authorizing Provider  acetaminophen (TYLENOL) 650 MG CR tablet Take 650 mg by mouth every 8 (eight) hours as needed for pain.   Yes [provider]  Ascorbic Acid (VITAMIN C PO) Take 1 tablet by mouth daily.   Yes [provider]  ibuprofen (ADVIL) 800 MG tablet Take 800 mg by mouth every 8 (eight) hours as needed for fever or moderate pain.   Yes [provider]  Multiple Vitamin (MULTIVITAMIN WITH MINERALS) TABS tablet Take 1 tablet by mouth daily.   Yes [provider]  albuterol (PROVENTIL HFA;VENTOLIN HFA) 108 (90 Base) MCG/ACT inhaler  Inhale 2 puffs into the lungs every 6 (six) hours as needed for wheezing or shortness of breath. Patient not taking: Reported on 11/29/2019 11/24/18   Shade Flood, MD  albuterol (VENTOLIN HFA) 108 (90 Base) MCG/ACT inhaler Inhale 1-2 puffs into the lungs every 6 (six) hours as needed for wheezing or shortness of breath. 11/29/19   Terald Sleeper, MD  fluticasone (FLOVENT HFA) 110 MCG/ACT inhaler Inhale 1 puff into the lungs 2 (two) times daily. Patient not taking: Reported on 11/29/2019 11/24/18   Shade Flood, MD  predniSONE (DELTASONE) 20 MG tablet 3 by mouth for 3 days, then 2 by mouth for 2 days, then 1 by mouth for 2 days, then 1/2 by mouth for 2 days. Patient not taking: Reported on 11/29/2019  11/24/18   Shade Flood, MD    Allergies    Pineapple, Claritin [loratadine], and Flonase [fluticasone propionate]  Review of Systems   Review of Systems  Constitutional: Positive for fatigue. Negative for chills and fever.  Eyes: Negative for pain and visual disturbance.  Respiratory: Positive for chest tightness and shortness of breath. Negative for cough.   Cardiovascular: Negative for chest pain, palpitations and leg swelling.  Gastrointestinal: Negative for abdominal pain and vomiting.  Musculoskeletal: Positive for arthralgias and myalgias.  Skin: Negative for color change and rash.  Neurological: Positive for syncope and light-headedness. Negative for numbness and headaches.  All other systems reviewed and are negative.   Physical Exam Updated Vital Signs BP 137/81   Pulse 81   Temp 98.8 F (37.1 C) (Oral)   Resp 19   Ht 5\' 11"  (1.803 m)   Wt 84.8 kg   SpO2 100%   BMI 26.08 kg/m   Physical Exam Vitals and nursing note reviewed.  Constitutional:      General: She is not in acute distress.    Appearance: She is well-developed.     Comments: Appears tired  HENT:     Head: Normocephalic and atraumatic.  Eyes:     Pupils: Pupils are equal, round, and reactive to light.     Comments: Sclera injected bilaterally  Cardiovascular:     Rate and Rhythm: Normal rate and regular rhythm.     Pulses: Normal pulses.  Pulmonary:     Effort: Pulmonary effort is normal. No respiratory distress.     Breath sounds: Normal breath sounds.     Comments: Mild end expiratory wheezing bilaterally Abdominal:     General: There is no distension.     Palpations: Abdomen is soft.     Tenderness: There is no abdominal tenderness.  Musculoskeletal:     Cervical back: Neck supple.  Skin:    General: Skin is warm and dry.  Neurological:     General: No focal deficit present.     Mental Status: She is alert and oriented to person, place, and time.     Sensory: No sensory deficit.       Motor: No weakness.     ED Results / Procedures / Treatments   Labs (all labs ordered are listed, but only abnormal results are displayed) Labs Reviewed  BASIC METABOLIC PANEL - Abnormal; Notable for the following components:      Result Value   Potassium 3.3 (*)    All other components within normal limits  RAPID URINE DRUG SCREEN, HOSP PERFORMED - Abnormal; Notable for the following components:   Cocaine POSITIVE (*)    Tetrahydrocannabinol POSITIVE (*)    All  other components within normal limits  ETHANOL - Abnormal; Notable for the following components:   Alcohol, Ethyl (B) 208 (*)    All other components within normal limits  CBC  CK  I-STAT BETA HCG BLOOD, ED ([facility], [facility], [facility] ONLY)  TROPONIN I (HIGH SENSITIVITY)    EKG EKG Interpretation  Date/Time:  Wednesday November 29 2019 08:59:07 EDT Ventricular Rate:  78 PR Interval:    QRS Duration: 100 QT Interval:  399 QTC Calculation: 455 R Axis:   83 Text Interpretation: Sinus rhythm No STEMI Confirmed by Alvester Chou [phone]) on 11/29/2019 9:03:40 AM   Radiology DG Chest Portable 1 View  Result Date: 11/29/2019 CLINICAL DATA:  Shortness of breath.  Increased fatigue. EXAM: PORTABLE CHEST 1 VIEW COMPARISON:  02/02/2017 FINDINGS: The heart size and mediastinal contours are within normal limits. Both lungs are clear. The visualized skeletal structures are unremarkable. IMPRESSION: Normal examination. Electronically Signed   By: Beckie Salts M.D.   On: 11/29/2019 09:54    Procedures Procedures (including critical care time)  Medications Ordered in ED Medications  albuterol (VENTOLIN HFA) 108 (90 Base) MCG/ACT inhaler 2 puff (2 puffs Inhalation Given 11/29/19 0926)  sodium chloride 0.9 % bolus 500 mL (0 mLs Intravenous Stopped 11/29/19 1014)    ED Course  I have reviewed the triage vital signs and the nursing notes.  Pertinent labs & imaging results that were available during my care of the patient were reviewed by me  and considered in my medical decision making (see chart for details).  34 year old female presenting to the emergency department with a possible episode of syncope.  She does not recall any events last night after taking 2 shots of liquor.  She woke up to her fianc sternal rubbing her this morning.  She feels extremely sluggish and worn out.  On exam she is tired but oriented.  She has no focal neurological deficits.  She has no evidence of head trauma.  She has mild end expiratory wheezing bilaterally which I suspect may be her untreated asthma, will give her some albuterol puffs here.  PERC negative - no tachycardia, tachypnea, or hypoxia.  Doubtful this is a PE, particularly one large enough to cause syncope, with her current presentation.  We'll also perform a syncope workup including ecg, trop, hgb level, electrolyte levels, istat hcg, dg chest Will check UDS and ethanol level for possible drug toxidrome CK level for the possibility of seizure.  She reports family hx of seizure in her mother but she herself has never had one, and had no incontinence today.  ECG on arrival with NSR, no evidence of heart block or prolonged QTc.  If her workup is negative, she'll need cardiology follow up, as this is apparently her second unexplained syncopal episode that occurred at rest.  Clinical Course as of Nov 28 1908  Wed Nov 29, 2019  1023 Patient ambulating to bathroom.  Mother now at bedside.    [MT]  1053 Discussed UDS and ethanol level with patient.  She endorses eating a weed brownie 2 days ago.  It's possible there was some delayed effect from this.  Her mother asks for neurology referral because of a family hx of seizures.  I explained I do not think this   [MT]    Clinical Course User Index [MT] Felcia Huebert, Kermit Balo, MD    Final Clinical Impression(s) / ED Diagnoses Final diagnoses:  Fatigue, unspecified type  Alcoholic intoxication without complication (HCC)    Rx /  DC Orders ED  Discharge Orders         Ordered    albuterol (VENTOLIN HFA) 108 (90 Base) MCG/ACT inhaler  Every 6 hours PRN     11/29/19 0929           Terald Sleeper, MD 11/29/19 1911

## 2019-12-08 ENCOUNTER — Ambulatory Visit: Payer: BC Managed Care – PPO

## 2019-12-15 ENCOUNTER — Ambulatory Visit: Payer: BC Managed Care – PPO | Attending: Internal Medicine

## 2019-12-15 DIAGNOSIS — Z23 Encounter for immunization: Secondary | ICD-10-CM

## 2019-12-15 NOTE — Progress Notes (Signed)
   Covid-19 Vaccination Clinic  Name:  Brianna Garrett    MRN: [id number] DOB: [birth year]  12/15/2019  [name] was observed post Covid-19 immunization for 15 minutes without incident. She was provided with Vaccine Information Sheet and instruction to access the V-Safe system.   [name] was instructed to call 911 with any severe reactions post vaccine: Marland Kitchen Difficulty breathing  . Swelling of face and throat  . A fast heartbeat  . A bad rash all over body  . Dizziness and weakness   Immunizations Administered    Name Date Dose VIS Date Route   Pfizer COVID-19 Vaccine 12/15/2019  9:05 AM 0.3 mL 08/18/2019 Intramuscular   Manufacturer: [employer], [employer]   Lot: [id number]   NDC: [address]-8

## 2020-01-08 ENCOUNTER — Ambulatory Visit: Payer: BC Managed Care – PPO | Attending: Internal Medicine

## 2020-01-08 DIAGNOSIS — Z23 Encounter for immunization: Secondary | ICD-10-CM

## 2020-01-08 NOTE — Progress Notes (Signed)
   Covid-19 Vaccination Clinic  Name:  HEATH BADON    MRN: [id number] DOB: 07-17-1986  01/08/2020  Ms. Raven was observed post Covid-19 immunization for 30 minutes based on pre-vaccination screening without incident. She was provided with Vaccine Information Sheet and instruction to access the V-Safe system.   Ms. Meisenheimer was instructed to call 911 with any severe reactions post vaccine: Marland Kitchen Difficulty breathing  . Swelling of face and throat  . A fast heartbeat  . A bad rash all over body  . Dizziness and weakness   Immunizations Administered    Name Date Dose VIS Date Route   Pfizer COVID-19 Vaccine 01/08/2020 10:39 AM 0.3 mL 11/01/2018 Intramuscular   Manufacturer: [employer], [employer]   Lot: [id number]   NDC: [address]-0

## 2020-06-01 IMAGING — DX DG CHEST 1V PORT
1 series · 1 of 1 positions shown · non-contrast
Comparison: 02/02/2017

CLINICAL DATA: Shortness of breath.  Increased fatigue.

EXAM:
PORTABLE CHEST 1 VIEW

[chest ap]
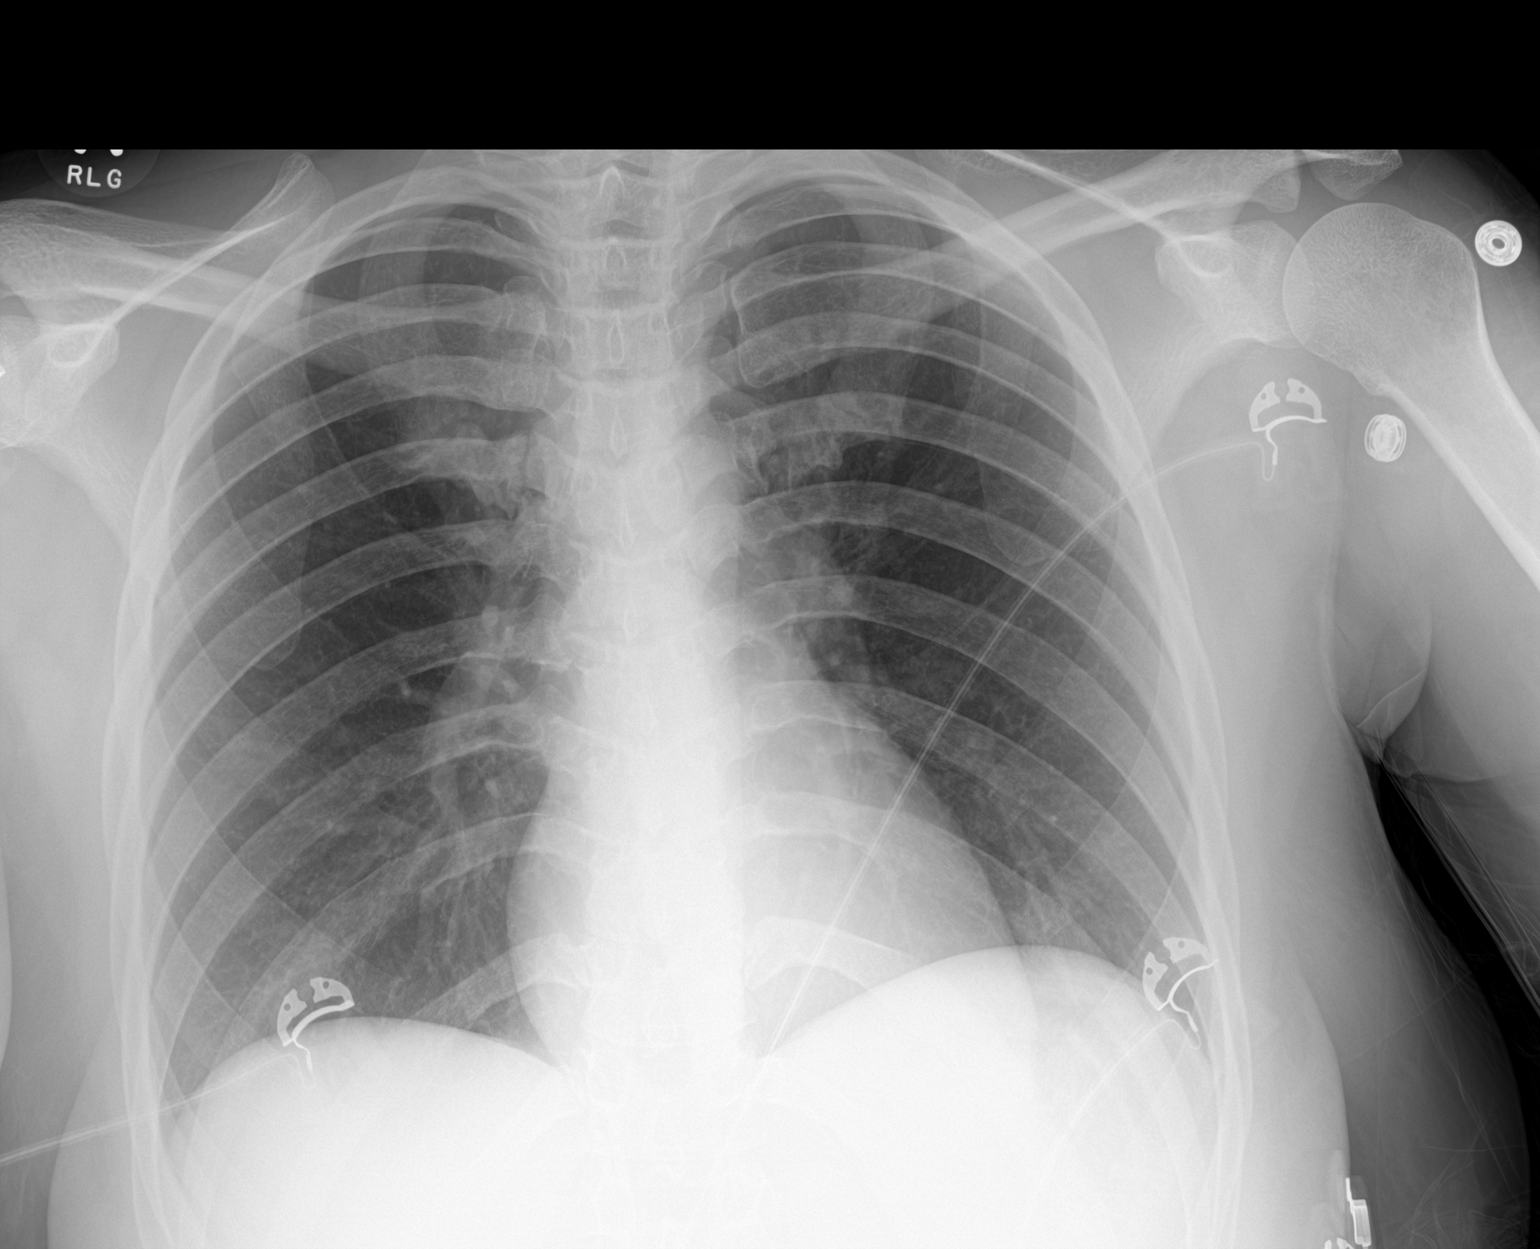

[1 of 1 positions shown; findings below may reference images not displayed]

FINDINGS: The heart size and mediastinal contours are within normal limits.
Both lungs are clear. The visualized skeletal structures are
unremarkable.
IMPRESSION: Normal examination.
